# Patient Record
Sex: Male | Born: 1965 | Race: Black or African American | Hispanic: No | Marital: Married | Smoking: Former smoker
Health system: Southern US, Community
[De-identification: ages and names within clinical notes are randomized; demographics above are authoritative.]

## PROBLEM LIST (undated history)

## (undated) DIAGNOSIS — M65961 Unspecified synovitis and tenosynovitis, right lower leg: Secondary | ICD-10-CM

## (undated) DIAGNOSIS — F1490 Cocaine use, unspecified, uncomplicated: Secondary | ICD-10-CM

## (undated) DIAGNOSIS — I10 Essential (primary) hypertension: Secondary | ICD-10-CM

## (undated) DIAGNOSIS — M659 Synovitis and tenosynovitis, unspecified: Secondary | ICD-10-CM

## (undated) DIAGNOSIS — R55 Syncope and collapse: Secondary | ICD-10-CM

## (undated) DIAGNOSIS — I639 Cerebral infarction, unspecified: Secondary | ICD-10-CM

## (undated) DIAGNOSIS — R7989 Other specified abnormal findings of blood chemistry: Secondary | ICD-10-CM

## (undated) HISTORY — DX: Syncope and collapse: R55

## (undated) HISTORY — DX: Other specified abnormal findings of blood chemistry: R79.89

## (undated) HISTORY — DX: Unspecified synovitis and tenosynovitis, right lower leg: M65.961

## (undated) HISTORY — DX: Cerebral infarction, unspecified: I63.9

## (undated) HISTORY — DX: Synovitis and tenosynovitis, unspecified: M65.9

## (undated) HISTORY — PX: APPENDECTOMY: SHX54

## (undated) HISTORY — DX: Cocaine use, unspecified, uncomplicated: F14.90

## (undated) HISTORY — DX: Essential (primary) hypertension: I10

---

## 1999-03-04 ENCOUNTER — Encounter: Payer: Self-pay | Admitting: Emergency Medicine

## 1999-03-04 ENCOUNTER — Emergency Department (HOSPITAL_COMMUNITY): Admission: EM | Admit: 1999-03-04 | Discharge: 1999-03-04 | Payer: Self-pay | Admitting: Emergency Medicine

## 2006-04-06 ENCOUNTER — Emergency Department (HOSPITAL_COMMUNITY): Admission: EM | Admit: 2006-04-06 | Discharge: 2006-04-06 | Payer: Self-pay | Admitting: *Deleted

## 2008-09-15 ENCOUNTER — Emergency Department (HOSPITAL_COMMUNITY): Admission: EM | Admit: 2008-09-15 | Discharge: 2008-09-15 | Payer: Self-pay | Admitting: Emergency Medicine

## 2011-12-25 ENCOUNTER — Ambulatory Visit: Payer: Medicaid Other | Admitting: Physical Medicine & Rehabilitation

## 2012-01-26 ENCOUNTER — Ambulatory Visit: Payer: Medicaid Other | Admitting: Physical Medicine & Rehabilitation

## 2012-02-19 ENCOUNTER — Encounter: Payer: Self-pay | Admitting: Physical Medicine & Rehabilitation

## 2012-02-19 ENCOUNTER — Ambulatory Visit (HOSPITAL_COMMUNITY)
Admission: RE | Admit: 2012-02-19 | Discharge: 2012-02-19 | Disposition: A | Discharge status: Home / Self Care | Payer: Medicaid Other | Source: Ambulatory Visit | Attending: Physical Medicine & Rehabilitation | Admitting: Physical Medicine & Rehabilitation

## 2012-02-19 ENCOUNTER — Ambulatory Visit (HOSPITAL_BASED_OUTPATIENT_CLINIC_OR_DEPARTMENT_OTHER): Payer: Medicaid Other | Admitting: Physical Medicine & Rehabilitation

## 2012-02-19 ENCOUNTER — Encounter: Payer: Medicaid Other | Attending: Physical Medicine & Rehabilitation

## 2012-02-19 VITALS — BP 127/76 | HR 80 | Resp 16 | Ht 70.0 in | Wt 257.0 lb

## 2012-02-19 DIAGNOSIS — M545 Low back pain, unspecified: Secondary | ICD-10-CM | POA: Insufficient documentation

## 2012-02-19 DIAGNOSIS — M533 Sacrococcygeal disorders, not elsewhere classified: Secondary | ICD-10-CM

## 2012-02-19 DIAGNOSIS — M5137 Other intervertebral disc degeneration, lumbosacral region: Secondary | ICD-10-CM | POA: Insufficient documentation

## 2012-02-19 DIAGNOSIS — M51379 Other intervertebral disc degeneration, lumbosacral region without mention of lumbar back pain or lower extremity pain: Secondary | ICD-10-CM | POA: Insufficient documentation

## 2012-02-19 MED ORDER — NAPROXEN 500 MG PO TABS: 500.0000 mg | ORAL_TABLET | Freq: Two times a day (BID) | ORAL | Status: DC

## 2012-02-19 NOTE — Patient Instructions (Signed)
Back Pain, Adult Low back pain is very common. About 1 in 5 people have back pain.The cause of low back pain is rarely dangerous. The pain often gets better over time.About half of people with a sudden onset of back pain feel better in just 2 weeks. About 8 in 10 people feel better by 6 weeks.  CAUSES Some common causes of back pain include:  Strain of the muscles or ligaments supporting the spine.   Wear and tear (degeneration) of the spinal discs.   Arthritis.   Direct injury to the back.  DIAGNOSIS Most of the time, the direct cause of low back pain is not known.However, back pain can be treated effectively even when the exact cause of the pain is unknown.Answering your caregiver's questions about your overall health and symptoms is one of the most accurate ways to make sure the cause of your pain is not dangerous. If your caregiver needs more information, he or she may order lab work or imaging tests (X-rays or MRIs).However, even if imaging tests show changes in your back, this usually does not require surgery. HOME CARE INSTRUCTIONS For many people, back pain returns.Since low back pain is rarely dangerous, it is often a condition that people can learn to manageon their own.   Remain active. It is stressful on the back to sit or stand in one place. Do not sit, drive, or stand in one place for more than 30 minutes at a time. Take short walks on level surfaces as soon as pain allows.Try to increase the length of time you walk each day.   Do not stay in bed.Resting more than 1 or 2 days can delay your recovery.   Do not avoid exercise or work.Your body is made to move.It is not dangerous to be active, even though your back may hurt.Your back will likely heal faster if you return to being active before your pain is gone.   Pay attention to your body when you bend and lift. Many people have less discomfortwhen lifting if they bend their knees, keep the load close to their  bodies,and avoid twisting. Often, the most comfortable positions are those that put less stress on your recovering back.   Find a comfortable position to sleep. Use a firm mattress and lie on your side with your knees slightly bent. If you lie on your back, put a pillow under your knees.   Only take over-the-counter or prescription medicines as directed by your caregiver. Over-the-counter medicines to reduce pain and inflammation are often the most helpful.Your caregiver may prescribe muscle relaxant drugs.These medicines help dull your pain so you can more quickly return to your normal activities and healthy exercise.   Put ice on the injured area.   Put ice in a plastic bag.   Place a towel between your skin and the bag.   Leave the ice on for 15 to 20 minutes, 3 to 4 times a day for the first 2 to 3 days. After that, ice and heat may be alternated to reduce pain and spasms.   Ask your caregiver about trying back exercises and gentle massage. This may be of some benefit.   Avoid feeling anxious or stressed.Stress increases muscle tension and can worsen back pain.It is important to recognize when you are anxious or stressed and learn ways to manage it.Exercise is a great option.  SEEK MEDICAL CARE IF:  You have pain that is not relieved with rest or medicine.   You have   pain that does not improve in 1 week.   You have new symptoms.   You are generally not feeling well.  SEEK IMMEDIATE MEDICAL CARE IF:   You have pain that radiates from your back into your legs.   You develop new bowel or bladder control problems.   You have unusual weakness or numbness in your arms or legs.   You develop nausea or vomiting.   You develop abdominal pain.   You feel faint.  Document Released: 09/28/2005 Document Revised: 09/17/2011 Document Reviewed: 02/16/2011 [employer] Patient Information 2012 [employer]. 

## 2012-02-19 NOTE — Progress Notes (Signed)
  Subjective:    Patient ID: Ross Wheeler, male    DOB: 1966-05-05, 46 y.o.   MRN: [id number]  HPI 10 year history of gradual onset low back pain. No pain shooting down the legs. No history of trauma or injury. Has had some x-rays several years ago but none through the [facility] cone system. Has seen his primary care physician Dr. Loleta Chance. Has tried pain medicines such as oxycodone and hydrocodone. He's also tried muscle relaxers. No physical therapy, no injections, no surgery in the past. 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 General activity 8 Relation with others 8 Enjoyment of life 10 What TIME of day is your pain at its worst? morning Sleep (in general) Poor  Pain is worse with: some activites Pain improves with: medication Relief from Meds: 3  Mobility walk without assistance how many minutes can you walk? all day ability to climb steps?  yes do you drive?  yes Do you have any goals in this area?  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  All other systems reviewed and are negative.       Objective:   Physical Exam  Constitutional: He is oriented to person, place, and time.  Musculoskeletal:       Lumbar back: He exhibits pain. He exhibits normal range of motion, no tenderness and no swelling.       PSIS pain Positive Faber's maneuver with pain in the SI joint area  Neurological: He is alert and oriented to person, place, and time. He has normal strength. No sensory deficit.  Reflex Scores:      Tricep reflexes are  2+ on the right side and 2+ on the left side.      Bicep reflexes are 2+ on the right side and 2+ on the left side.      Brachioradialis reflexes are 2+ on the right side and 2+ on the left side.      Patellar reflexes are 2+ on the right side and 2+ on the left side.      Achilles reflexes are 2+ on the right side and 2+ on the left side.         Assessment & Plan:  1. Axial back pain exam is most consistent with sacroiliac joint dysfunction. Will send to some physical therapy. We'll get lumbosacral spine films. Will start nonsteroidal. Failing this sacroiliac injection. I'll see him back in one month.

## 2012-04-19 ENCOUNTER — Encounter: Payer: Medicaid Other | Attending: Physical Medicine & Rehabilitation

## 2012-04-19 ENCOUNTER — Ambulatory Visit (HOSPITAL_BASED_OUTPATIENT_CLINIC_OR_DEPARTMENT_OTHER): Payer: Medicaid Other | Admitting: Physical Medicine & Rehabilitation

## 2012-04-19 ENCOUNTER — Encounter: Payer: Self-pay | Admitting: Physical Medicine & Rehabilitation

## 2012-04-19 VITALS — BP 139/80 | HR 76 | Resp 16 | Ht 70.0 in | Wt 258.2 lb

## 2012-04-19 DIAGNOSIS — M545 Low back pain, unspecified: Secondary | ICD-10-CM | POA: Insufficient documentation

## 2012-04-19 DIAGNOSIS — M5137 Other intervertebral disc degeneration, lumbosacral region: Secondary | ICD-10-CM

## 2012-04-19 MED ORDER — TRAMADOL HCL 50 MG PO TABS: 50.0000 mg | ORAL_TABLET | Freq: Four times a day (QID) | ORAL | Status: AC | PRN

## 2012-04-19 MED ORDER — MELOXICAM 15 MG PO TABS: 15.0000 mg | ORAL_TABLET | Freq: Every day | ORAL | Status: AC

## 2012-04-19 MED ORDER — CYCLOBENZAPRINE HCL 10 MG PO TABS: 10.0000 mg | ORAL_TABLET | Freq: Every evening | ORAL | Status: AC | PRN

## 2012-04-19 NOTE — Progress Notes (Signed)
Subjective:    Patient ID: Ross Wheeler, male    DOB: [birth year], 46 y.o.   MRN: [id number]  HPI No change in symptoms. Low back pain is the main complaint. No leg or foot pain. Working full-time in Aeronautical engineer Pain Inventory Average Pain 8 Pain Right Now 8 My pain is constant and burning  In the last 24 hours, has pain interfered with the following? General activity 7 Relation with others 7 Enjoyment of life 9 What TIME of day is your pain at its worst? night Sleep (in general) Poor  Pain is worse with: bending Pain improves with: nothing helps Relief from Meds: no pain meds  Mobility walk without assistance ability to climb steps?  yes do you drive?  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  no  Physicians involved in your care Any changes since last visit?  no   Family History  Problem Relation Age of Onset  . Hypertension Mother   . Diabetes Father   . Heart disease Father   . Hypertension Father    History   Social History  . Marital Status: Married    Spouse Name: N/A    Number of Children: N/A  . Years of Education: N/A   Social History Main Topics  . Smoking status: Former Smoker    Types: Cigarettes  . Smokeless tobacco: Never Used  . Alcohol Use: None  . Drug Use: None  . Sexually Active: None   Other Topics Concern  . None   Social History Narrative  . None   Past Surgical History  Procedure Date  . Appendectomy    Past Medical History  Diagnosis Date  . Hypertension    BP 139/80  Pulse 76  Resp 16  Ht 5\' 10"  (1.778 m)  Wt 258 lb 3.2 oz (117.119 kg)  BMI 37.05 kg/m2  SpO2 97%    Review of Systems  Musculoskeletal: Positive for back pain.  All other systems reviewed and are negative.       Objective:   Physical Exam  Constitutional: He is oriented to person, place, and time. He appears  well-developed.       obese  HENT:  Head: Normocephalic and atraumatic.  Eyes: Conjunctivae and EOM are normal. Pupils are equal, round, and reactive to light.  Musculoskeletal: He exhibits no edema and no tenderness.       Lumbar back: He exhibits decreased range of motion, bony tenderness and pain. He exhibits no swelling, no edema and no deformity.       Negative straight leg raising test Tenderness over the PSIS area bilaterally. Pain with bending over as well as to the right  Neurological: He is alert and oriented to person, place, and time. He has normal strength. No sensory deficit. Coordination and gait normal.          Assessment & Plan:  1. 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 No evidence of radiculopathy. X-rays showing L3-L4 and L5-S1 disc space narrowing. In addition there is evidence of spondylosis in the facet joints at L3-4 L4-5 and L5-S1 levels. Will send him to physical therapy once a week for 4 weeks Start tramadol 50 mg 4 times a day as needed for pain Cyclobenzaprine 10 mg each bedtime Mobic in place of Naprosyn. I'll see the patient back in 4 weeks. If  not much better consider MRI of the lumbar spine

## 2012-04-19 NOTE — Patient Instructions (Signed)
You will start taking meloxicam once a dayin place of naproxen You'll start tramadol as needed for pain up to 4 times per day You'll take cyclobenzaprine a muscle relaxer at night before you go to bed You'll go to physical therapy at [facility] 4 visits

## 2012-05-27 ENCOUNTER — Encounter: Payer: Medicaid Other | Attending: Physical Medicine & Rehabilitation

## 2012-05-27 ENCOUNTER — Ambulatory Visit: Payer: Medicaid Other | Admitting: Physical Medicine & Rehabilitation

## 2012-05-27 DIAGNOSIS — M545 Low back pain, unspecified: Secondary | ICD-10-CM | POA: Insufficient documentation

## 2012-06-06 ENCOUNTER — Other Ambulatory Visit: Payer: Self-pay | Admitting: Physical Medicine & Rehabilitation

## 2012-09-22 ENCOUNTER — Other Ambulatory Visit: Payer: Self-pay | Admitting: Physical Medicine & Rehabilitation

## 2013-01-16 ENCOUNTER — Other Ambulatory Visit: Payer: Self-pay | Admitting: Physical Medicine & Rehabilitation

## 2013-04-06 IMAGING — CR DG LUMBAR SPINE 2-3V
3 series · 3 of 3 positions shown · non-contrast
Comparison: None.

CLINICAL DATA: Low back pain for 10 years.

LUMBAR SPINE - 2-3 VIEW

[t l-spine a.p.]
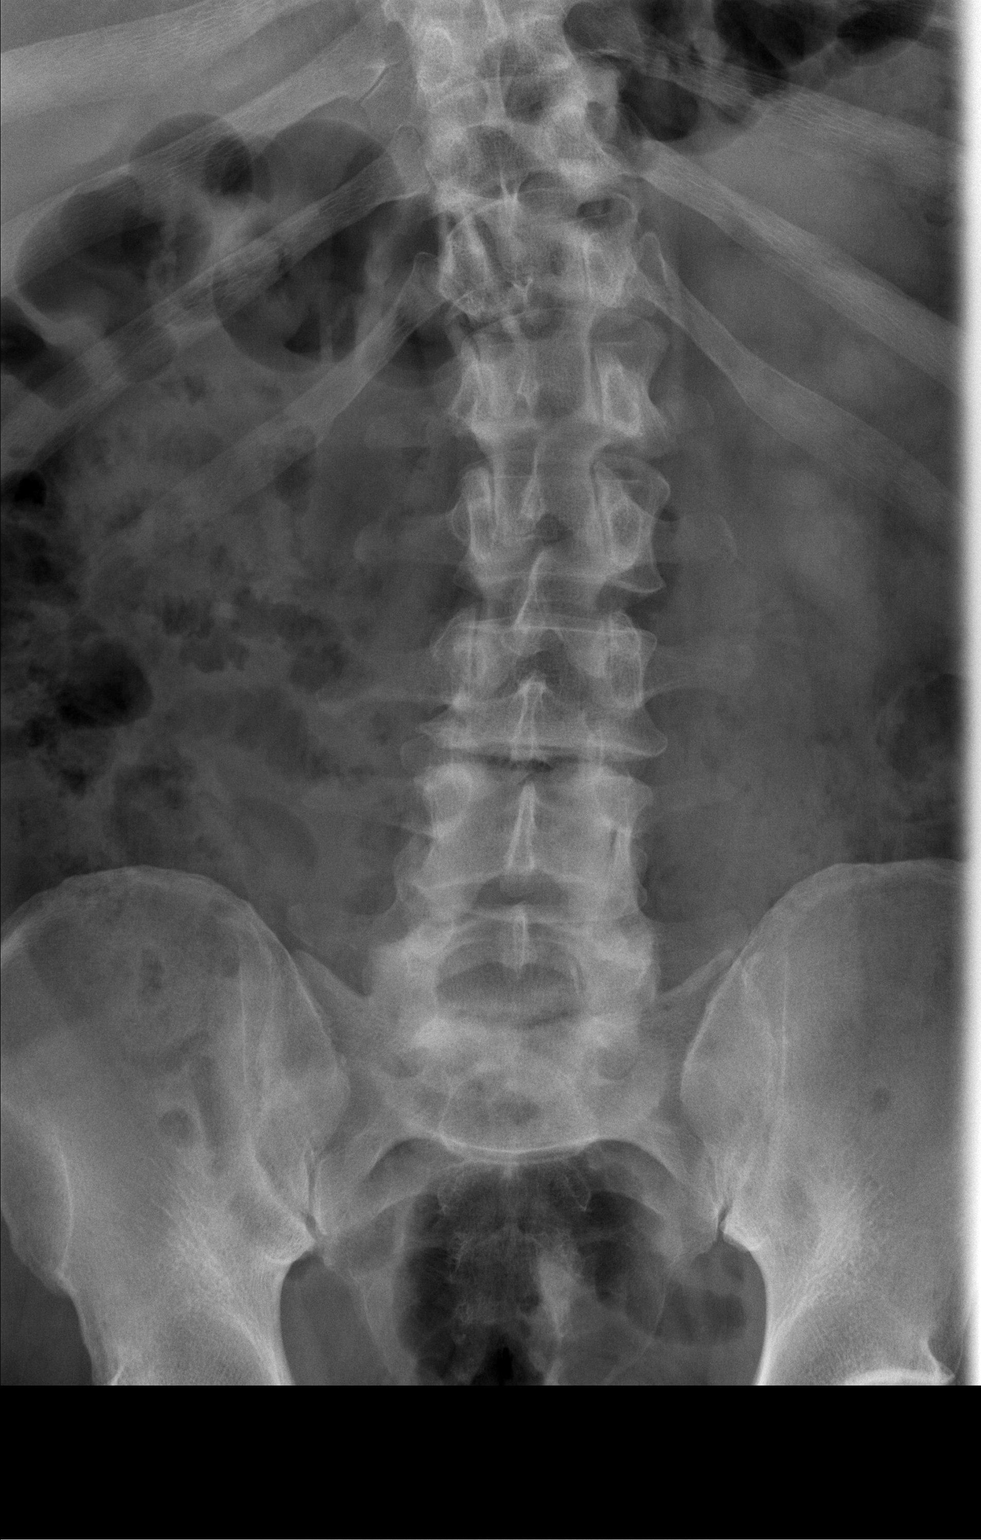

[t l-spine lat]
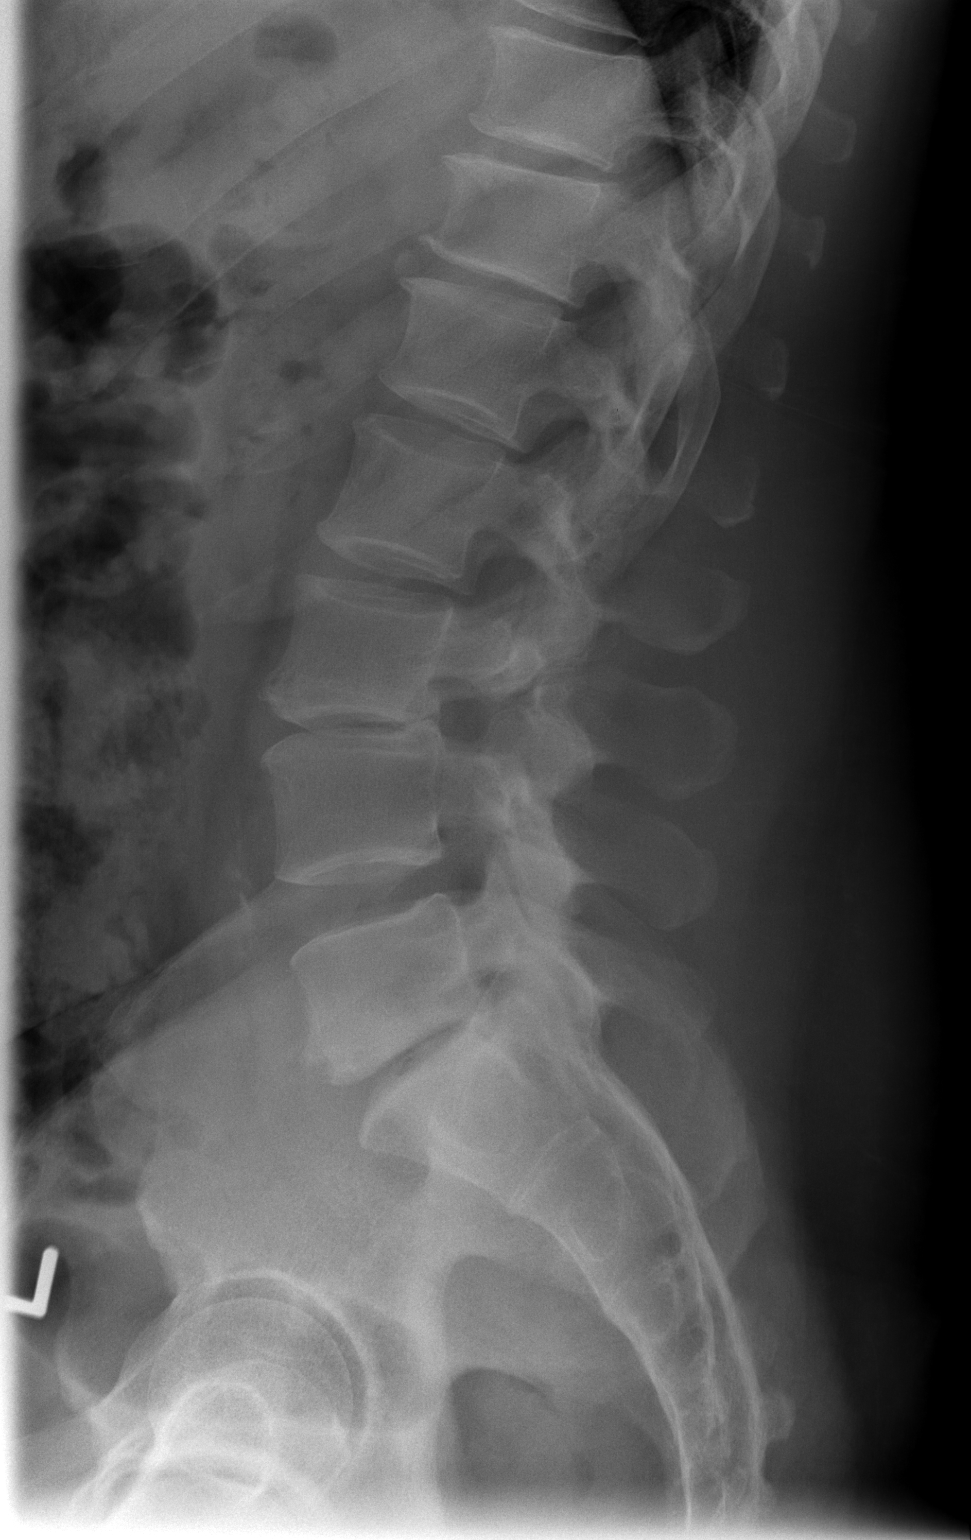

[t l-spine l5-s1 spot]
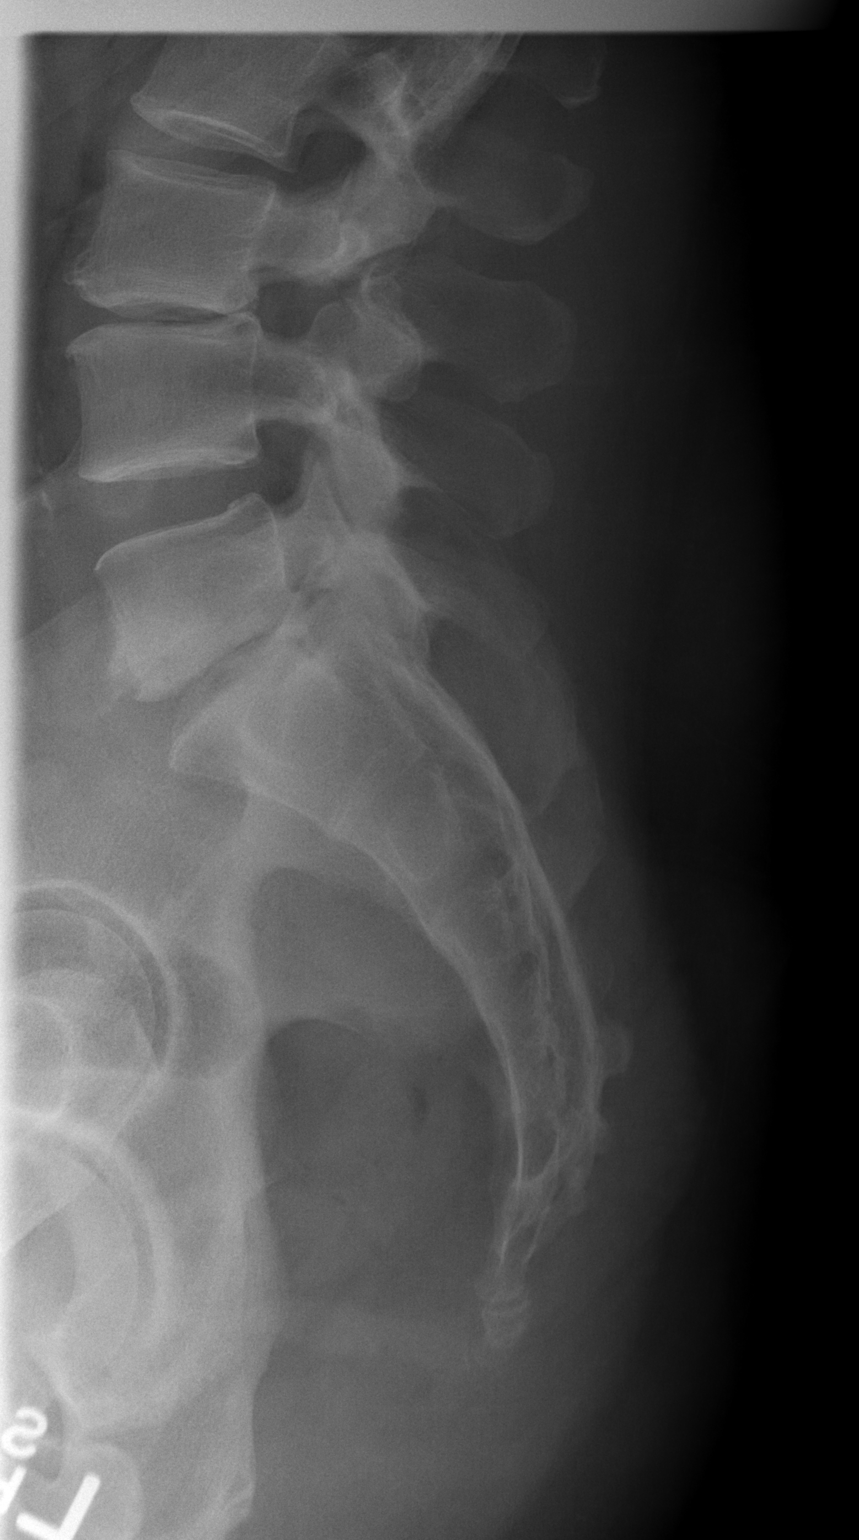

[3 of 3 positions shown; findings below may reference images not displayed]

FINDINGS: Disc space narrowing L3-L4 is accompanied by 3 mm
anterolisthesis..  Also disc space narrowing L5-S1.  No osseous
destructive lesion.  Slight thoracic scoliosis incompletely
evaluated. Other intervertebral disc spaces appear well preserved.
No visible SI joint erosion.
IMPRESSION: Degenerative change as described.

## 2013-07-13 ENCOUNTER — Ambulatory Visit: Payer: Medicaid Other | Admitting: Physical Medicine & Rehabilitation

## 2014-01-19 ENCOUNTER — Ambulatory Visit: Payer: Medicaid Other | Admitting: Physical Medicine & Rehabilitation

## 2019-06-24 ENCOUNTER — Other Ambulatory Visit: Payer: Self-pay

## 2019-06-24 DIAGNOSIS — Z20822 Contact with and (suspected) exposure to covid-19: Secondary | ICD-10-CM

## 2019-06-26 LAB — NOVEL CORONAVIRUS, NAA: SARS-CoV-2, NAA: NOT DETECTED

## 2019-11-23 ENCOUNTER — Encounter: Payer: Self-pay | Admitting: Orthopaedic Surgery

## 2019-11-23 ENCOUNTER — Ambulatory Visit (INDEPENDENT_AMBULATORY_CARE_PROVIDER_SITE_OTHER): Payer: Self-pay

## 2019-11-23 ENCOUNTER — Ambulatory Visit (INDEPENDENT_AMBULATORY_CARE_PROVIDER_SITE_OTHER): Payer: Self-pay | Admitting: Orthopaedic Surgery

## 2019-11-23 ENCOUNTER — Telehealth: Payer: Self-pay | Admitting: Radiology

## 2019-11-23 ENCOUNTER — Other Ambulatory Visit: Payer: Self-pay

## 2019-11-23 VITALS — BP 125/65 | HR 83 | Ht 72.0 in | Wt 225.0 lb

## 2019-11-23 DIAGNOSIS — G8929 Other chronic pain: Secondary | ICD-10-CM

## 2019-11-23 DIAGNOSIS — M7989 Other specified soft tissue disorders: Secondary | ICD-10-CM

## 2019-11-23 DIAGNOSIS — M545 Low back pain: Secondary | ICD-10-CM

## 2019-11-23 NOTE — Telephone Encounter (Signed)
I called patient to advise negative for DVT.  Continue medications given by Dr. Olena Leatherwood and begin PT.  Male answered and we were disconnected. Tried to call back, no answer.   Patient called [place] office back and spoke with Carlon.  She advised on instructions per Dr. Ophelia Charter. Patient concerned with what he should do about the pain that he is in and states Tramadol does not help that much. Patient advised PT will hopefully give him some relief.

## 2019-11-23 NOTE — Progress Notes (Signed)
Office Visit Note   Patient: Ross Wheeler           Date of Birth: 1966/06/09           MRN: [id number] Visit Date: 11/23/2019              Requested by: No referring provider defined for this encounter. PCP: Toma Deiters, MD   Assessment & Plan: Visit Diagnoses:  1. Chronic right-sided low back pain, unspecified whether sciatica present     Plan: We will obtain a venous Doppler make sure he does not have saphenous vein DVT.  We discussed physical therapy referral for evaluation and treatment.  No findings of radiculopathy or hip osteoarthritis on exam.  Patient can call if he like to proceed with the therapy.  Follow-Up Instructions: No follow-ups on file.   Orders:  Orders Placed This Encounter  Procedures  . XR Lumbar Spine 2-3 Views   No orders of the defined types were placed in this encounter.     Procedures: No procedures performed   Clinical Data: No additional findings.   Subjective: Chief Complaint  Patient presents with  . Right Knee - Pain  . Lower Back - Pain    HPI 54 year old male referred by Dr.Hasanaj with some pain anteriorly over is right proximal patellar tendon and also medial groin pain that radiates down his the medial aspect of his thigh to his knee.  He does not recall any specific injury.  Pain is been bad for 2 days.  He has had trouble walking when he tried to keep his knees together as he states it aggravates his pain.  He is using WD-40 and states that does not take the pain away but he is able to move better.  He denies any specific trauma.  No problems putting his socks on.  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 Patient had a prednisone Dosepak and states it really did not help.  He is used some tramadol for pain. Review of Systems positive for hypertension history of ulcers.  Patient does not smoke.  Positive for hypertension on HCTZ and verapamil. Remote history of injury to his right knee when he  was young when he fell and landed on his knee.  Objective: Vital Signs: BP 125/65   Pulse 83   Ht 6' (1.829 m)   Wt 225 lb (102.1 kg)   BMI 30.52 kg/m   Physical Exam Constitutional:      Appearance: He is well-developed.  HENT:     Head: Normocephalic and atraumatic.  Eyes:     Pupils: Pupils are equal, round, and reactive to light.  Neck:     Thyroid: No thyromegaly.     Trachea: No tracheal deviation.  Cardiovascular:     Rate and Rhythm: Normal rate.  Pulmonary:     Effort: Pulmonary effort is normal.     Breath sounds: No wheezing.  Abdominal:     General: Bowel sounds are normal.     Palpations: Abdomen is soft.  Skin:    General: Skin is warm and dry.     Capillary Refill: Capillary refill takes less than 2 seconds.  Neurological:     Mental Status: He is alert and oriented to person, place, and time.  Psychiatric:        Behavior: Behavior normal.        Thought Content: Thought content normal.        Judgment:  Judgment normal.     Ortho Exam patient has negative logroll the hips no limitation internal rotation right or left.  No hip flexion weakness.  Minimal discomfort with adduction resisted testing.  He has some tenderness along the saphenous medial thigh.  Semitendinosis semimembranosus normal no palpable Baker's cyst.  He does have tenderness over the proximal patellar tendon fairly diffuse not localized to the central portion.  Pes bursa is nontender collateral ligaments ACL exam is normal no significant crepitus with knee range of motion.  Specialty Comments:  No specialty comments available.  Imaging: XR Lumbar Spine 2-3 Views  Result Date: 11/23/2019 AP lateral lumbar spine x-rays and AP pelvis show hips obtained and reviewed.  This shows a left thoracolumbar curve.  No hip osteoarthritic changes.  Disc base narrowing at L3-4, L4-5 and L5-S1.  Negative for acute changes. Impression: Lumbar curvature.  X-ray negative for acute changes.    PMFS  History: Patient Active Problem List   Diagnosis Date Noted  . Degeneration of lumbar or lumbosacral intervertebral disc 04/19/2012  . Sacroiliac dysfunction 02/19/2012   Past Medical History:  Diagnosis Date  . Hypertension     Family History  Problem Relation Age of Onset  . Hypertension Mother   . Diabetes Father   . Heart disease Father   . Hypertension Father     Past Surgical History:  Procedure Laterality Date  . APPENDECTOMY     Social History   Occupational History  . Not on file  Tobacco Use  . Smoking status: Former Smoker    Types: Cigarettes  . Smokeless tobacco: Never Used  Substance and Sexual Activity  . Alcohol use: Not on file  . Drug use: Not on file  . Sexual activity: Not on file

## 2020-04-18 ENCOUNTER — Ambulatory Visit: Payer: Self-pay | Admitting: Orthopaedic Surgery

## 2020-04-18 ENCOUNTER — Other Ambulatory Visit: Payer: Self-pay

## 2020-04-25 ENCOUNTER — Other Ambulatory Visit: Payer: Self-pay

## 2020-04-25 ENCOUNTER — Ambulatory Visit: Payer: Self-pay | Admitting: Orthopaedic Surgery

## 2020-05-02 ENCOUNTER — Ambulatory Visit (INDEPENDENT_AMBULATORY_CARE_PROVIDER_SITE_OTHER): Payer: Self-pay | Admitting: Orthopaedic Surgery

## 2020-05-02 ENCOUNTER — Encounter: Payer: Self-pay | Admitting: Orthopaedic Surgery

## 2020-05-02 ENCOUNTER — Other Ambulatory Visit: Payer: Self-pay

## 2020-05-02 ENCOUNTER — Ambulatory Visit (INDEPENDENT_AMBULATORY_CARE_PROVIDER_SITE_OTHER): Payer: Self-pay

## 2020-05-02 VITALS — BP 116/84 | Ht 72.0 in | Wt 215.0 lb

## 2020-05-02 DIAGNOSIS — M542 Cervicalgia: Secondary | ICD-10-CM

## 2020-05-02 DIAGNOSIS — M4722 Other spondylosis with radiculopathy, cervical region: Secondary | ICD-10-CM | POA: Insufficient documentation

## 2020-05-02 DIAGNOSIS — M659 Synovitis and tenosynovitis, unspecified: Secondary | ICD-10-CM

## 2020-05-02 NOTE — Progress Notes (Signed)
Office Visit Note   Patient: Ross Wheeler           Date of Birth: 1966-02-04           MRN: [id number] Visit Date: 05/02/2020              Requested by: Toma Deiters, MD [id number] [place],  [place] [address] PCP: Toma Deiters, MD   Assessment & Plan: Visit Diagnoses:  1. Neck pain   2. Synovitis of right knee   3. Other spondylosis with radiculopathy, cervical region     Plan: We reviewed the x-rays of his neck that showed significant spondylosis.  Intra-articular injection performed in his knee we will see how he does with this and recheck him in 3 weeks.  He is having persistent problems with his neck we may need to consider diagnostic imaging cervical spine.  We reviewed the plain x-rays which showed some calcific tendinopathy of the proximal midportion of the patella which appears old and chronic.  I discussed with him that this is not a fracture of the inferior pole of the patella.  Recheck 3 weeks.  Follow-Up Instructions: Return in about 3 weeks (around 05/23/2020).   Orders:  Orders Placed This Encounter  Procedures  . XR Cervical Spine 2 or 3 views   No orders of the defined types were placed in this encounter.     Procedures: No procedures performed   Clinical Data: No additional findings.   Subjective: Chief Complaint  Patient presents with  . Neck - Pain  . Right Knee - Pain    HPI 54 year old male seen with significant neck pain greater than a 6 on a 1-10 scale times greater than a year.  Patient states she fell on 13th went to the ER June 8 and 9 that x-rays which showed bone spurs.  There is question about whether there was fracture of the inferior pole of the patella.  Patient states he has had pain in his neck and also pain in his right knee medial posterior medial.  No myelopathic changes.  Patient got some improvement with Medrol Dosepak.  He is used Tylenol Zanaflex Ultram.  Review of Systems 14 point systems otherwise negative  noncontributory to HPI.   Objective: Vital Signs: BP 116/84   Ht 6' (1.829 m)   Wt (!) 215 lb (97.5 kg)   BMI 29.16 kg/m   Physical Exam Constitutional:      Appearance: He is well-developed.  HENT:     Head: Normocephalic and atraumatic.  Eyes:     Pupils: Pupils are equal, round, and reactive to light.  Neck:     Thyroid: No thyromegaly.     Trachea: No tracheal deviation.  Cardiovascular:     Rate and Rhythm: Normal rate.  Pulmonary:     Effort: Pulmonary effort is normal.     Breath sounds: No wheezing.  Abdominal:     General: Bowel sounds are normal.     Palpations: Abdomen is soft.  Skin:    General: Skin is warm and dry.     Capillary Refill: Capillary refill takes less than 2 seconds.  Neurological:     Mental Status: He is alert and oriented to person, place, and time.  Psychiatric:        Behavior: Behavior normal.        Thought Content: Thought content normal.        Judgment: Judgment normal.     Ortho  Exam Patient has bilateral brachial plexus tenderness increased pain with cervical compression some relief with distraction.  Biceps triceps brachial radialis are strong negative drop arm test.  Increased pain with forward flexion.  No lower extremity clonus.  No tenderness at the proximal portion of the patella.  Medial and posterior medial joint line pain.  Negative logroll of the hips distal pulses are 2+. Right knee has 2+ synovitis present.  Negative Lachman negative pivot shift.  Some pain with patellofemoral loading and quadriceps contracture. Specialty Comments:  No specialty comments available.  Imaging: No results found.   PMFS History: Patient Active Problem List   Diagnosis Date Noted  . Synovitis of right knee 05/02/2020  . Other spondylosis with radiculopathy, cervical region 05/02/2020  . Degeneration of lumbar or lumbosacral intervertebral disc 04/19/2012  . Sacroiliac dysfunction 02/19/2012   Past Medical History:  Diagnosis Date    . Hypertension     Family History  Problem Relation Age of Onset  . Hypertension Mother   . Diabetes Father   . Heart disease Father   . Hypertension Father     Past Surgical History:  Procedure Laterality Date  . APPENDECTOMY     Social History   Occupational History  . Not on file  Tobacco Use  . Smoking status: Former Smoker    Types: Cigarettes  . Smokeless tobacco: Never Used  Substance and Sexual Activity  . Alcohol use: Not on file  . Drug use: Not on file  . Sexual activity: Not on file

## 2020-05-07 ENCOUNTER — Telehealth: Payer: Self-pay | Admitting: Radiology

## 2020-05-07 NOTE — Telephone Encounter (Signed)
Please see message from patient below.  Can you fax note?  [facility] is working with [facility] and would like his notes faxed to [phone] Attn: Ronita Hipps  Their telephone number is [phone] if you need to call them for verification

## 2020-05-13 NOTE — Telephone Encounter (Signed)
[facility], they do have him as a client but, they do not have a consent to fax. They will prepare one for patient to sign and once he signs they will send over to [place].

## 2020-05-23 ENCOUNTER — Other Ambulatory Visit: Payer: Self-pay

## 2020-05-23 ENCOUNTER — Encounter: Payer: Self-pay | Admitting: Orthopaedic Surgery

## 2020-05-23 ENCOUNTER — Ambulatory Visit (INDEPENDENT_AMBULATORY_CARE_PROVIDER_SITE_OTHER): Payer: Self-pay | Admitting: Orthopaedic Surgery

## 2020-05-23 VITALS — BP 126/68 | Ht 72.0 in | Wt 220.0 lb

## 2020-05-23 DIAGNOSIS — M25561 Pain in right knee: Secondary | ICD-10-CM

## 2020-05-27 NOTE — Progress Notes (Signed)
Office Visit Note   Patient: Ross Wheeler           Date of Birth: 04/25/1966           MRN: [id number] Visit Date: 05/23/2020              Requested by: Toma Deiters, MD [address] [place],  [place] [address] PCP: Toma Deiters, MD   Assessment & Plan: Visit Diagnoses:  1. Right knee pain, unspecified chronicity     Plan: Plan MRI scan right knee.  He asked about stronger narcotic medication I discussed with him I recommend diagnostic imaging.  He has had several prescriptions for tramadol in the past which he states works slightly but not really that well.  Follow-up after MRI scan.  Follow-Up Instructions: No follow-ups on file.   Orders:  Orders Placed This Encounter  Procedures  . MR Knee Right w/o contrast   No orders of the defined types were placed in this encounter.     Procedures: No procedures performed   Clinical Data: No additional findings.   Subjective: Chief Complaint  Patient presents with  . Neck - Follow-up  . Right Knee - Follow-up    HPI 54 year old male returns with ongoing problems with right knee pain.  Patient states that since the injection 05/02/2020 he is not gotten any relief his pain is gotten to the point where it is rated 10 out of 10 and he is having great difficulty walking.  He is used ibuprofen and states has not been able to work because of his knee being so severely painful.  He states he has pain with every step pain wakes him up at night.  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  He states he is tender along the medial joint line lateral joint line.  No groin pain no numbness or tingling in the feet.  Patient works as a Estate agent.  He is used tramadol for pain in the past.  [facility]  website reviewed narcotics 311, sedative 130 overall risk or 230. He relates old history of injuring his knee when he fell on it when he was young. Review  of Systems 14 point systems otherwise noncontributory to HPI.  No history of gout.  Positive for cervical spondylosis multilevel.   Objective: Vital Signs: BP 126/68   Ht 6' (1.829 m)   Wt 220 lb (99.8 kg)   BMI 29.84 kg/m   Physical Exam Constitutional:      Appearance: He is well-developed.  HENT:     Head: Normocephalic and atraumatic.  Eyes:     Pupils: Pupils are equal, round, and reactive to light.  Neck:     Thyroid: No thyromegaly.     Trachea: No tracheal deviation.  Cardiovascular:     Rate and Rhythm: Normal rate.  Pulmonary:     Effort: Pulmonary effort is normal.     Breath sounds: No wheezing.  Abdominal:     General: Bowel sounds are normal.     Palpations: Abdomen is soft.  Skin:    General: Skin is warm and dry.     Capillary Refill: Capillary refill takes less than 2 seconds.  Neurological:     Mental Status: He is alert and oriented to person, place, and time.  Psychiatric:        Behavior: Behavior normal.  Thought Content: Thought content normal.        Judgment: Judgment normal.     Ortho Exam patient has negative pivot shift he complains of severe pain with palpation along the medial joint line and lateral joint line.  Negative logroll to the hips.  Pes bursa is moderately tender some discomfort with patellar loading.  Calcification of the proximal portion of the patellar tendon corresponding with x-ray but not particularly tender.  Quad tendon is normal.  Negative apprehension test.  Medial collateral ligament exam is normal.  Opposite knee shows negative anterior drawer negative pivot shift good range of motion.  Distal pulses are 2 Homan.  Specialty Comments:  No specialty comments available.  Imaging: No results found.   PMFS History: Patient Active Problem List   Diagnosis Date Noted  . Synovitis of right knee 05/02/2020  . Other spondylosis with radiculopathy, cervical region 05/02/2020  . Degeneration of lumbar or lumbosacral  intervertebral disc 04/19/2012  . Sacroiliac dysfunction 02/19/2012   Past Medical History:  Diagnosis Date  . Hypertension     Family History  Problem Relation Age of Onset  . Hypertension Mother   . Diabetes Father   . Heart disease Father   . Hypertension Father     Past Surgical History:  Procedure Laterality Date  . APPENDECTOMY     Social History   Occupational History  . Not on file  Tobacco Use  . Smoking status: Former Smoker    Types: Cigarettes  . Smokeless tobacco: Never Used  Substance and Sexual Activity  . Alcohol use: Not on file  . Drug use: Not on file  . Sexual activity: Not on file

## 2020-06-06 ENCOUNTER — Ambulatory Visit (INDEPENDENT_AMBULATORY_CARE_PROVIDER_SITE_OTHER): Payer: Self-pay | Admitting: Orthopaedic Surgery

## 2020-06-06 ENCOUNTER — Encounter: Payer: Self-pay | Admitting: Orthopaedic Surgery

## 2020-06-06 ENCOUNTER — Other Ambulatory Visit: Payer: Self-pay

## 2020-06-06 VITALS — Ht 72.0 in | Wt 220.0 lb

## 2020-06-06 DIAGNOSIS — M659 Synovitis and tenosynovitis, unspecified: Secondary | ICD-10-CM

## 2020-06-07 NOTE — Progress Notes (Signed)
Office Visit Note   Patient: Ross Wheeler           Date of Birth: 1966-04-05           MRN: [id number] Visit Date: 06/06/2020              Requested by: Toma Deiters, MD [address] [place],  [place] [address] PCP: Toma Deiters, MD   Assessment & Plan: Visit Diagnoses: No diagnosis found.  Plan: I discussed with patient no evidence of ligamentous injury he does have trace degenerative changes not unusual for his age of 54.  We discussed using anti-inflammatories.  He asked about stronger narcotic medication I discussed with him that it might help him for a very brief time that he would then  need higher dosages, increased amounts and is not recommended for chronic pain problems.  He can continue to use Aspercreme, ice at the end of the day and ibuprofen or Aleve. [facility] website review  Narx Report.   Follow-Up Instructions: Return if symptoms worsen or fail to improve.   Orders:  No orders of the defined types were placed in this encounter.  No orders of the defined types were placed in this encounter.     Procedures: No procedures performed   Clinical Data: No additional findings.   Subjective: Chief Complaint  Patient presents with  . Right Knee - Pain, Follow-up    MRI right knee review    HPI 54 year old male returns with ongoing problems with his knee.  He states he has pain along the edge of the patella both medial joint line and lateral joint line.  He is taken anti-inflammatories also had some tramadol and states that this is not really working for him.  He does lawn maintenance work and states whenever he is on his feet he has some increased pain.  MRI scan of the knee has been obtained which shows slight area calcification superior portion of the patellar tendon appears old and chronic consistent with plain radiographs.  His meniscus cruciate ligaments are normal patellofemoral joint is normal.  Medial lateral compartment just shows extremely mild  degenerative changes.  Patient continues to have some problems with his neck where some spondylosis is present.  Review of Systems 14 point update unchanged from 11/23/2019 office visit other than as mentioned above.  Objective: Vital Signs: Ht 6' (1.829 m)   Wt 220 lb (99.8 kg)   BMI 29.84 kg/m   Physical Exam Constitutional:      Appearance: He is well-developed.  HENT:     Head: Normocephalic and atraumatic.  Eyes:     Pupils: Pupils are equal, round, and reactive to light.  Neck:     Thyroid: No thyromegaly.     Trachea: No tracheal deviation.  Cardiovascular:     Rate and Rhythm: Normal rate.  Pulmonary:     Effort: Pulmonary effort is normal.     Breath sounds: No wheezing.  Abdominal:     General: Bowel sounds are normal.     Palpations: Abdomen is soft.  Skin:    General: Skin is warm and dry.     Capillary Refill: Capillary refill takes less than 2 seconds.  Neurological:     Mental Status: He is alert and oriented to person, place, and time.  Psychiatric:        Behavior: Behavior normal.        Thought Content: Thought content normal.  Judgment: Judgment normal.     Ortho Exam no tenderness of the superior portion of the right patellar tendon.  He complains of pain with some palpation at the anterior joint line medially and laterally.  Collateral ligaments are stable ACL PCL exam is normal negative logroll the hips knee and ankle jerk are intact anterior tib gastrocsoleus is intact. Specialty Comments:  No specialty comments available.  Imaging: MRI knee done [facility]   Medial: Mildly degenerated.   Lateral: Mildly degenerated.   Joint: No joint effusion.   Popliteal Fossa: Tiny Baker's cyst.   Extensor Mechanism: Intact. Small ossification in the superior  fibers of the patellar tendon consistent with remote tendinopathy  noted.   Bones: No fracture, stress change or focal lesion.   Other: None.   IMPRESSION:    Negative for meniscal or ligamenttear.   Small ossifications superior fibers of the patellar tendon is likely  due to remote tendinopathy or strain. Negative for tear.   Mild medial and lateral compartment degenerative change.    Electronically Signed   By: Drusilla Kanner M.D.  On: 06/05/2020 13:51    PMFS History: Patient Active Problem List   Diagnosis Date Noted  . Synovitis of right knee 05/02/2020  . Other spondylosis with radiculopathy, cervical region 05/02/2020  . Degeneration of lumbar or lumbosacral intervertebral disc 04/19/2012  . Sacroiliac dysfunction 02/19/2012   Past Medical History:  Diagnosis Date  . Hypertension     Family History  Problem Relation Age of Onset  . Hypertension Mother   . Diabetes Father   . Heart disease Father   . Hypertension Father     Past Surgical History:  Procedure Laterality Date  . APPENDECTOMY     Social History   Occupational History  . Not on file  Tobacco Use  . Smoking status: Former Smoker    Types: Cigarettes  . Smokeless tobacco: Never Used  Substance and Sexual Activity  . Alcohol use: Not on file  . Drug use: Not on file  . Sexual activity: Not on file

## 2020-08-08 ENCOUNTER — Other Ambulatory Visit: Payer: Self-pay

## 2020-08-08 ENCOUNTER — Ambulatory Visit (INDEPENDENT_AMBULATORY_CARE_PROVIDER_SITE_OTHER): Payer: Self-pay | Admitting: Orthopaedic Surgery

## 2020-08-08 ENCOUNTER — Encounter: Payer: Self-pay | Admitting: Orthopaedic Surgery

## 2020-08-08 VITALS — Ht 72.0 in | Wt 220.0 lb

## 2020-08-08 DIAGNOSIS — M659 Synovitis and tenosynovitis, unspecified: Secondary | ICD-10-CM

## 2020-08-08 DIAGNOSIS — M7651 Patellar tendinitis, right knee: Secondary | ICD-10-CM | POA: Insufficient documentation

## 2020-08-08 MED ORDER — LIDOCAINE HCL 1 % IJ SOLN
0.5000 mL | INTRAMUSCULAR | Status: AC | PRN
  Administered 2020-08-08: .5 mL

## 2020-08-08 MED ORDER — METHYLPREDNISOLONE ACETATE 40 MG/ML IJ SUSP
40.0000 mg | INTRAMUSCULAR | Status: AC | PRN
  Administered 2020-08-08: 40 mg via INTRA_ARTICULAR

## 2020-08-08 MED ORDER — BUPIVACAINE HCL 0.25 % IJ SOLN
4.0000 mL | INTRAMUSCULAR | Status: AC | PRN
  Administered 2020-08-08: 4 mL via INTRA_ARTICULAR

## 2020-08-08 NOTE — Progress Notes (Signed)
Office Visit Note   Patient: Ross Wheeler           Date of Birth: May 19, 1966           MRN: [id number] Visit Date: 08/08/2020              Requested by: Toma Deiters, MD [address] [place],  [place] [address] PCP: Toma Deiters, MD   Assessment & Plan: Visit Diagnoses:  1. Synovitis of right knee   2. Patellar tendinitis of right knee     Plan: Repeat injection performed.  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  He can look into getting a Cho-Pat strap and see if this helps his knee symptoms with his calcific tendinopathy.  Follow-up as needed.  Follow-Up Instructions: No follow-ups on file.   Orders:  Orders Placed This Encounter  Procedures  . Large Joint Inj   No orders of the defined types were placed in this encounter.     Procedures: Large Joint Inj: R knee on 08/08/2020 4:34 PM Indications: pain and joint swelling Details: 22 G 1.5 in needle, anterolateral approach  Arthrogram: No  Medications: 40 mg methylPREDNISolone acetate 40 MG/ML; 0.5 mL lidocaine 1 %; 4 mL bupivacaine 0.25 % Outcome: tolerated well, no immediate complications Procedure, treatment alternatives, risks and benefits explained, specific risks discussed. Consent was given by the patient. Immediately prior to procedure a time out was called to verify the correct patient, procedure, equipment, support staff and site/side marked as required. Patient was prepped and draped in the usual sterile fashion.       Clinical Data: No additional findings.   Subjective: Chief Complaint  Patient presents with  . 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  He does more lifting is having increased problems with his  right knee but the knee immobilizer on when he gets home.  He does better if his knee is extended points directly over the patellar tendon where he has pain and some pain medially that occasionally radiates up following the gracilis.  He does have some spondylosis cervical spine.  He is looked into job with truck driving school coming up shortly.  PDMP reviewed once again.  Patient's been on anti-inflammatories.  He did get short-term relief with previous injection.  MRI scan showed some calcific tendinopathy at the proximal portion of the patellar tendon. MRI did show some mild medial compartment chondral wear.  Menisci were intact. Review of Systems 14 point system update unchanged from 8/12 and 05/02/2020 office visits.   Objective: Vital Signs: Ht 6' (1.829 m)   Wt 220 lb (99.8 kg)   BMI 29.84 kg/m   Physical Exam Constitutional:      Appearance: He is well-developed.  HENT:     Head: Normocephalic and atraumatic.  Eyes:     Pupils: Pupils are equal, round, and reactive to light.  Neck:     Thyroid: No thyromegaly.     Trachea: No tracheal deviation.  Cardiovascular:     Rate and Rhythm: Normal rate.  Pulmonary:     Effort: Pulmonary effort is normal.     Breath sounds: No wheezing.  Abdominal:     General:  Bowel sounds are normal.     Palpations: Abdomen is soft.  Skin:    General: Skin is warm and dry.     Capillary Refill: Capillary refill takes less than 2 seconds.  Neurological:     Mental Status: He is alert and oriented to person, place, and time.  Psychiatric:        Behavior: Behavior normal.        Thought Content: Thought content normal.        Judgment: Judgment normal.     Ortho Exam patient withdraws with palpation approximate for the patellar tendon.  He has medial joint line tenderness.  Trace effusion is noted.  Specialty Comments:  No specialty comments available.  Imaging: No results found.   PMFS History: Patient Active Problem List    Diagnosis Date Noted  . Patellar tendinitis of right knee 08/08/2020  . Synovitis of right knee 05/02/2020  . Other spondylosis with radiculopathy, cervical region 05/02/2020  . Degeneration of lumbar or lumbosacral intervertebral disc 04/19/2012  . Sacroiliac dysfunction 02/19/2012   Past Medical History:  Diagnosis Date  . Hypertension     Family History  Problem Relation Age of Onset  . Hypertension Mother   . Diabetes Father   . Heart disease Father   . Hypertension Father     Past Surgical History:  Procedure Laterality Date  . APPENDECTOMY     Social History   Occupational History  . Not on file  Tobacco Use  . Smoking status: Former Smoker    Types: Cigarettes  . Smokeless tobacco: Never Used  Substance and Sexual Activity  . Alcohol use: Not on file  . Drug use: Not on file  . Sexual activity: Not on file

## 2020-08-20 ENCOUNTER — Other Ambulatory Visit (HOSPITAL_COMMUNITY): Payer: Self-pay | Admitting: Internal Medicine

## 2020-08-20 DIAGNOSIS — G8929 Other chronic pain: Secondary | ICD-10-CM

## 2020-09-02 ENCOUNTER — Other Ambulatory Visit: Payer: Self-pay

## 2020-09-02 ENCOUNTER — Ambulatory Visit (HOSPITAL_COMMUNITY)
Admission: RE | Admit: 2020-09-02 | Discharge: 2020-09-02 | Disposition: A | Discharge status: Home / Self Care | Payer: 59 | Source: Ambulatory Visit | Attending: Internal Medicine | Admitting: Internal Medicine

## 2020-09-02 DIAGNOSIS — M5442 Lumbago with sciatica, left side: Secondary | ICD-10-CM | POA: Insufficient documentation

## 2020-09-02 DIAGNOSIS — G8929 Other chronic pain: Secondary | ICD-10-CM | POA: Insufficient documentation

## 2020-12-26 NOTE — Congregational Nurse Program (Signed)
  Dept: [phone]   Congregational Nurse Program Note  Date of Encounter: 12/24/2020  Past Medical History: Past Medical History:  Diagnosis Date  . Hypertension     Encounter Details:  CNP Questionnaire - 12/26/20 2230      Questionnaire   Do you give verbal consent to treat you today? Yes    Visit Setting Church or Engineer, technical sales Patient Served At Home of [employer], [place]    Patient Status Homeless    Medical Provider Yes    Insurance Uninsured (Includes [employer] Card/Care Connects)    Intervention Refer;Assess (including screenings);Educate    Housing/Utilities No permanent housing    Transportation Need transportation assistance    Medication Have medication insecurities    Referrals [facility]         Stated he has history of hypertension, but since he lost his job he doesn't have any insurance and ran out of medications. Offered to send him to be seen in the ER but he declined. Discussed the importance of his prescribed medication,watching his sodium intake and getting exercisei. Will make an. Appointment for him to be seen at [facility]. Called and scheduled an appointment for Tuesday 3/22 @ 8:15 am [facility] RN, [place], [phone]

## 2022-12-24 ENCOUNTER — Encounter (HOSPITAL_COMMUNITY): Payer: Self-pay

## 2022-12-26 ENCOUNTER — Emergency Department (HOSPITAL_COMMUNITY)
Admission: EM | Admit: 2022-12-26 | Discharge: 2022-12-26 | Disposition: A | Discharge status: Home / Self Care | Payer: Medicaid Other | Attending: Emergency Medicine | Admitting: Emergency Medicine

## 2022-12-26 ENCOUNTER — Emergency Department (HOSPITAL_COMMUNITY): Payer: Medicaid Other

## 2022-12-26 DIAGNOSIS — Z87891 Personal history of nicotine dependence: Secondary | ICD-10-CM | POA: Diagnosis not present

## 2022-12-26 DIAGNOSIS — Z79899 Other long term (current) drug therapy: Secondary | ICD-10-CM | POA: Diagnosis not present

## 2022-12-26 DIAGNOSIS — R519 Headache, unspecified: Secondary | ICD-10-CM | POA: Diagnosis present

## 2022-12-26 DIAGNOSIS — R112 Nausea with vomiting, unspecified: Secondary | ICD-10-CM | POA: Insufficient documentation

## 2022-12-26 DIAGNOSIS — I10 Essential (primary) hypertension: Secondary | ICD-10-CM | POA: Diagnosis not present

## 2022-12-26 DIAGNOSIS — R531 Weakness: Secondary | ICD-10-CM | POA: Insufficient documentation

## 2022-12-26 LAB — COMPREHENSIVE METABOLIC PANEL
ALT: 116 U/L — ABNORMAL HIGH (ref 0–44)
AST: 40 U/L (ref 15–41)
Albumin: 4 g/dL (ref 3.5–5.0)
Alkaline Phosphatase: 69 U/L (ref 38–126)
Anion gap: 12 (ref 5–15)
BUN: 16 mg/dL (ref 6–20)
CO2: 25 mmol/L (ref 22–32)
Calcium: 9.5 mg/dL (ref 8.9–10.3)
Chloride: 99 mmol/L (ref 98–111)
Creatinine, Ser: 1.02 mg/dL (ref 0.61–1.24)
GFR, Estimated: >60 mL/min (ref >60)
Glucose, Bld: 129 mg/dL — ABNORMAL HIGH (ref 70–99)
Potassium: 3.9 mmol/L (ref 3.5–5.1)
Sodium: 136 mmol/L (ref 135–145)
Total Bilirubin: 0.7 mg/dL (ref 0.3–1.2)
Total Protein: 7.5 g/dL (ref 6.5–8.1)

## 2022-12-26 LAB — CBC WITH DIFFERENTIAL/PLATELET
Abs Immature Granulocytes: 0.06 10*3/uL (ref 0.00–0.07)
Basophils Absolute: 0.1 10*3/uL (ref 0.0–0.1)
Basophils Relative: 0 %
Eosinophils Absolute: 0.2 10*3/uL (ref 0.0–0.5)
Eosinophils Relative: 1 %
HCT: 52.7 % — ABNORMAL HIGH (ref 39.0–52.0)
Hemoglobin: 16.9 g/dL (ref 13.0–17.0)
Immature Granulocytes: 0 %
Lymphocytes Relative: 9 %
Lymphs Abs: 1.2 10*3/uL (ref 0.7–4.0)
MCH: 26.2 pg (ref 26.0–34.0)
MCHC: 32.1 g/dL (ref 30.0–36.0)
MCV: 81.8 fL (ref 80.0–100.0)
Monocytes Absolute: 0.7 10*3/uL (ref 0.1–1.0)
Monocytes Relative: 5 %
Neutro Abs: 11.3 10*3/uL — ABNORMAL HIGH (ref 1.7–7.7)
Neutrophils Relative %: 85 %
Platelets: 321 10*3/uL (ref 150–400)
RBC: 6.44 MIL/uL — ABNORMAL HIGH (ref 4.22–5.81)
RDW: 13.9 % (ref 11.5–15.5)
WBC: 13.4 10*3/uL — ABNORMAL HIGH (ref 4.0–10.5)
nRBC: 0 % (ref 0.0–0.2)

## 2022-12-26 MED ORDER — HYDROMORPHONE HCL 1 MG/ML IJ SOLN
1.0000 mg | Freq: Once | INTRAMUSCULAR | Status: AC
  Administered 2022-12-26: 1 mg via INTRAVENOUS
  Filled 2022-12-26: qty 1

## 2022-12-26 MED ORDER — ONDANSETRON HCL 4 MG/2ML IJ SOLN
4.0000 mg | Freq: Once | INTRAMUSCULAR | Status: AC
  Administered 2022-12-26: 4 mg via INTRAVENOUS
  Filled 2022-12-26: qty 2

## 2022-12-26 MED ORDER — ONDANSETRON 4 MG PO TBDP: 4.0000 mg | ORAL_TABLET | Freq: Three times a day (TID) | ORAL | 1 refills | Status: AC | PRN

## 2022-12-26 NOTE — Discharge Instructions (Signed)
Neurology recommends close and early follow-up with Dr. Leonie Man with [facility] neurology.  They actually want his blood pressures to kind to be 1 30-1 50 range.  They do not see any reason for any additional MRIs today.  Take the Zofran as needed for nausea vomiting.  Return for any new or worse symptoms.  Continue your workup to get occupational therapy and physical therapy to evaluate him.

## 2022-12-26 NOTE — ED Triage Notes (Signed)
Pt bib ems from home c/o migraine that started today. Pt states he is nauseous and vomited once en route to hospital. 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 Pt recently started Plavix.   BP 170/110 HR 75 RA 95% CBG 124

## 2022-12-26 NOTE — ED Provider Notes (Addendum)
[facility] Provider Note   CSN: [id number] Arrival date & time: 12/26/22  1048     History  Chief Complaint  Patient presents with   Migraine    Ross Wheeler is a 57 y.o. male.  Patient brought in with a complaint of headache not severe that started today and associated with vomiting with some nausea.  No diarrhea.  No abdominal pain no chest pain no shortness of breath.  Patient was discharged from [facility] on February 29 following CVA with right-sided deficits including some visual deficits.  Then was readmitted March 13 and discharged yesterday March 15 for an extension of the CVA in the same area.  Patient's area of CVA is left thalamus posterior aspect of the hippocampus and left occipital lobe.  There was no history of any hemorrhagic strokes or bleeds.  Past medical history is significant for hypertension hyperlipidemia former smoker       Home Medications Prior to Admission medications   Medication Sig Start Date End Date Taking? Authorizing Provider  ondansetron (ZOFRAN-ODT) 4 MG disintegrating tablet Take 1 tablet (4 mg total) by mouth every 8 (eight) hours as needed for nausea or vomiting. 12/26/22  Yes Fredia Sorrow, MD  acetaminophen (TYLENOL) 650 MG CR tablet Take 650 mg by mouth every 8 (eight) hours as needed for pain.    [provider]  benazepril (LOTENSIN) 40 MG tablet Take 40 mg by mouth daily.    [provider]  hydrochlorothiazide (HYDRODIURIL) 25 MG tablet Take 25 mg by mouth daily.    [provider]  lisinopril (PRINIVIL,ZESTRIL) 10 MG tablet Take 10 mg by mouth daily.    Lucianne Lei, MD  methylPREDNISolone (MEDROL DOSEPAK) 4 MG TBPK tablet Take by mouth.    [provider]  naproxen (NAPROSYN) 500 MG tablet TAKE ONE TABLET TWICE DAILY WITH A MEAL Patient not taking: Reported on 11/23/2019 06/06/12   Charlett Blake, MD  tiZANidine (ZANAFLEX) 4 MG capsule  Take 4 mg by mouth 3 (three) times daily.    [provider]  traMADol (ULTRAM) 50 MG tablet Take by mouth every 6 (six) hours as needed.     [provider]  verapamil (CALAN-SR) 240 MG CR tablet Take 240 mg by mouth at bedtime.    Lucianne Lei, MD      Allergies    Patient has no known allergies.    Review of Systems   Review of Systems  Constitutional:  Negative for chills and fever.  HENT:  Negative for ear pain and sore throat.   Eyes:  Positive for visual disturbance. Negative for pain.  Respiratory:  Negative for cough and shortness of breath.   Cardiovascular:  Negative for chest pain and palpitations.  Gastrointestinal:  Positive for nausea and vomiting. Negative for abdominal pain.  Genitourinary:  Negative for dysuria and hematuria.  Musculoskeletal:  Negative for arthralgias and back pain.  Skin:  Negative for color change and rash.  Neurological:  Positive for weakness and headaches. Negative for seizures and syncope.  All other systems reviewed and are negative.   Physical Exam Updated Vital Signs BP (!) 153/100   Pulse 68   Temp 97.8 F (36.6 C) (Oral)   Resp 16   SpO2 99%  Physical Exam Vitals and nursing note reviewed.  Constitutional:      General: He is not in acute distress.    Appearance: Normal appearance. He is well-developed.  HENT:  Head: Normocephalic and atraumatic.     Mouth/Throat:     Mouth: Mucous membranes are moist.  Eyes:     Extraocular Movements: Extraocular movements intact.     Conjunctiva/sclera: Conjunctivae normal.     Pupils: Pupils are equal, round, and reactive to light.  Cardiovascular:     Rate and Rhythm: Normal rate and regular rhythm.     Heart sounds: No murmur heard. Pulmonary:     Effort: Pulmonary effort is normal. No respiratory distress.     Breath sounds: Normal breath sounds.  Abdominal:     Palpations: Abdomen is soft.     Tenderness: There is no abdominal tenderness.  Musculoskeletal:         General: No swelling.     Cervical back: Normal range of motion and neck supple. No rigidity.  Skin:    General: Skin is warm and dry.     Capillary Refill: Capillary refill takes less than 2 seconds.  Neurological:     Mental Status: He is alert. Mental status is at baseline.     Cranial Nerves: Cranial nerve deficit present.     Motor: Weakness present.     Comments: Patient without any worsening weakness on the right side.  Patient states he still has the visual deficit with the right eye.  Psychiatric:        Mood and Affect: Mood normal.     ED Results / Procedures / Treatments   Labs (all labs ordered are listed, but only abnormal results are displayed) Labs Reviewed  CBC WITH DIFFERENTIAL/PLATELET - Abnormal; Notable for the following components:      Result Value   WBC 13.4 (*)    RBC 6.44 (*)    HCT 52.7 (*)    Neutro Abs 11.3 (*)    All other components within normal limits  COMPREHENSIVE METABOLIC PANEL - Abnormal; Notable for the following components:   Glucose, Bld 129 (*)    ALT 116 (*)    All other components within normal limits    EKG EKG Interpretation  Date/Time:  Saturday December 26 2022 16:54:29 EDT Ventricular Rate:  66 PR Interval:  104 QRS Duration: 90 QT Interval:  420 QTC Calculation: 440 R Axis:   -11 Text Interpretation: Sinus rhythm with short PR Otherwise normal ECG No previous ECGs available Confirmed by Fredia Sorrow [id number] on 12/26/2022 4:55:46 PM  Radiology CT Head Wo Contrast  Result Date: 12/26/2022 CLINICAL DATA:  Sudden severe new onset headache, recent left PCA infarctions EXAM: CT HEAD WITHOUT CONTRAST TECHNIQUE: Contiguous axial images were obtained from the base of the skull through the vertex without intravenous contrast. RADIATION DOSE REDUCTION: This exam was performed according to the departmental dose-optimization program which includes automated exposure control, adjustment of the mA and/or kV according to patient  size and/or use of iterative reconstruction technique. COMPARISON:  12/23/2022 FINDINGS: Brain: No evidence of acute infarction, hemorrhage, hydrocephalus, extra-axial collection or mass lesion/mass effect. Left PCA territory infarctions previously identified by MRI are not apparent by CT. Vascular: No hyperdense vessel or unexpected calcification. Skull: Normal. Negative for fracture or focal lesion. Sinuses/Orbits: No acute finding. Other: None. IMPRESSION: No acute intracranial pathology. Left PCA territory infarctions previously identified by MRI are not apparent by CT. No evidence of hemorrhage or other complication. Electronically Signed   By: Delanna Ahmadi M.D.   On: 12/26/2022 14:57    Procedures Procedures    Medications Ordered in ED Medications  HYDROmorphone (DILAUDID) injection 1 mg (  1 mg Intravenous Given 12/26/22 1226)  ondansetron (ZOFRAN) injection 4 mg (4 mg Intravenous Given 12/26/22 1225)    ED Course/ Medical Decision Making/ A&P                             Medical Decision Making Amount and/or Complexity of Data Reviewed Labs: ordered. Radiology: ordered.  Risk Prescription drug management.   EKG without any arrhythmias.  Head CT here without evidence of any head bleed.  Patient improved with some pain medicine and antinausea medicine.  Symptoms could be a bit of a stomach bug or a viral illness.  CBC white count 13.4 hemoglobin [id number] complete metabolic panel blood sugar 129 renal function normal LFTs without significant abnormalities.  Head CT showed no intracranial pathology left PCA territorial infarcts previously identified by MRI or not apparent by CT.  No evidence of hemorrhage or other complication.  Had long discussion with neurohospitalist who reviewed all of the MRIs and CT from [facility] admission does not feel that we need to do additional MRIs today.  Patient's blood pressures in the range that they want [id number] to Q000111Q systolic.  Patient pretreated  with Zofran.  Close follow-up with Dr. Leonie Man from [facility] neurology.  And patient's mother is working on the physical therapy and Occupational Therapy that he needs following his discharge on March 15 from [facility].   Final Clinical Impression(s) / ED Diagnoses Final diagnoses:  Nonintractable headache, unspecified chronicity pattern, unspecified headache type  Nausea and vomiting, unspecified vomiting type    Rx / DC Orders ED Discharge Orders          Ordered    ondansetron (ZOFRAN-ODT) 4 MG disintegrating tablet  Every 8 hours PRN        12/26/22 1626              Fredia Sorrow, MD 12/26/22 1700    Fredia Sorrow, MD 12/26/22 1701

## 2022-12-30 ENCOUNTER — Ambulatory Visit: Payer: Medicaid Other | Admitting: Neurology

## 2022-12-30 ENCOUNTER — Encounter: Payer: Self-pay | Admitting: Neurology

## 2022-12-30 VITALS — BP 154/104 | HR 80 | Ht 72.0 in | Wt 220.0 lb

## 2022-12-30 DIAGNOSIS — H53461 Homonymous bilateral field defects, right side: Secondary | ICD-10-CM

## 2022-12-30 DIAGNOSIS — I639 Cerebral infarction, unspecified: Secondary | ICD-10-CM

## 2022-12-30 DIAGNOSIS — I63432 Cerebral infarction due to embolism of left posterior cerebral artery: Secondary | ICD-10-CM

## 2022-12-30 MED ORDER — CLOPIDOGREL BISULFATE 75 MG PO TABS: 75.0000 mg | ORAL_TABLET | Freq: Every day | ORAL | 5 refills | Status: AC

## 2022-12-30 NOTE — Progress Notes (Signed)
[facility] [address] [place]. [place] [address] [phone]       OFFICE CONSULT NOTE  Mr. Ross Wheeler Date of Birth:  09/27/1966 Medical Record Number:  [id number]   Referring MD:  Royann Shivers  Reason for Referral:  Stroke  HPI: Mr. [name] is a 57 year old pleasant African-American male seen today for initial office consultation visit for stroke.  He is accompanied by his mother.  History is obtained from them and review of electronic medical records in care everywhere.  Imaging results but actual films are not available for review.  He has past medical history of hypertension, hyperlipidemia, diabetes.  Clinical admitted to [facility] on 12/10/2022 with sudden onset of severe headache.  He thought the headache was migraine but did not go away for 2 days.  He also noticed decreased vision loss on the right side.  CT angiogram showed multifocal high-grade stenosis.  Occlusive embolus distal left P2 and proximal P3 segments of the left posterior cerebral artery.  There is moderate right P2 segment stenosis as well.  Patient was started on dual antiplatelet therapy aspirin and Plavix at discharge.  However he went back to the ER on 12/23/22 with altered mental status and worsening weakness on the right side with some generalized weakness.  He still has persistent right-sided hemianopsia.  MRI scan of the brain was repeated showed a few additional infarcts in the left medial temporal lobe, left occipital lobe and left thalamic in addition to expected evolutionary changes in the recent left PCA infarct.Carotid ultrasound Ross Wheeler previously in 12/10/2022 showed moderate right and mild left-sided plaque but less than 50% bilaterally 2D echo showed ejection fraction of 65 to 70%.  Left atrial size is upper limit of normal LDL cholesterol 116 mg percent.  A1c was 5.5 patient is presently on aspirin alone and is tolerating well without bruising or bleeding.  He states his blood  pressure is well-controlled at home though today it is 130/104.  He has quit smoking cigarettes and using marijuana.  He is currently wearing an external heart monitor for the last 1.  Has persistent right-sided sensory loss and peripheral vision loss.  He is a Production designer, theatre/television/film.  Lives will have trouble regarding his job.  He denies any prior history of strokes TIAs or significant neurological problems.  He does have a history of migraine headaches that  have not been very frequent.  Denies history of DVT, pulmonary embolism, family history of strokes or heart likely unrelated to ROS:   14 system review of systems is positive for vision loss, numbness, sensory loss, gait difficulty and all other systems negative  PMH:  Past Medical History:  Diagnosis Date   Hypertension     Social History:  Social History   Socioeconomic History   Marital status: Married    Spouse name: Not on file   Number of children: Not on file   Years of education: Not on file   Highest education level: Not on file  Occupational History   Not on file  Tobacco Use   Smoking status: Former    Types: Cigarettes   Smokeless tobacco: Never  Substance and Sexual Activity   Alcohol use: Not on file   Drug use: Not on file   Sexual activity: Not on file  Other Topics Concern   Not on file  Social History Narrative   Not on file   Social Determinants of Health   Financial Resource Strain: Not on  file  Food Insecurity: Not on file  Transportation Needs: Not on file  Physical Activity: Not on file  Stress: Not on file  Social Connections: Not on file  Intimate Partner Violence: Not on file    Medications:   Current Outpatient Medications on File Prior to Visit  Medication Sig Dispense Refill   acetaminophen (TYLENOL) 650 MG CR tablet Take 650 mg by mouth every 8 (eight) hours as needed for pain.     amLODipine (NORVASC) 10 MG tablet Take by mouth.     aspirin EC 81 MG tablet Take 81 mg by mouth  daily. Swallow whole.     atorvastatin (LIPITOR) 80 MG tablet Take 1 tablet by mouth daily.     DULoxetine (CYMBALTA) 20 MG capsule Take by mouth.     hydrochlorothiazide (HYDRODIURIL) 25 MG tablet Take 25 mg by mouth daily.     labetalol (NORMODYNE) 200 MG tablet Take by mouth.     levETIRAcetam (KEPPRA) 500 MG tablet Take by mouth.     losartan-hydrochlorothiazide (HYZAAR) 100-25 MG tablet Take by mouth.     ondansetron (ZOFRAN-ODT) 4 MG disintegrating tablet Take 1 tablet (4 mg total) by mouth every 8 (eight) hours as needed for nausea or vomiting. 12 tablet 1   tiZANidine (ZANAFLEX) 4 MG capsule Take 4 mg by mouth 3 (three) times daily.     traMADol (ULTRAM) 50 MG tablet Take by mouth every 6 (six) hours as needed.      No current facility-administered medications on file prior to visit.    Allergies:  No Known Allergies  Physical Exam General: well developed, well nourished pleasant middle-aged African-American male, seated, in no evident distress Head: head normocephalic and atraumatic.   Neck: supple with no carotid or supraclavicular bruits Cardiovascular: regular rate and rhythm, no murmurs Musculoskeletal: no deformity Skin:  no rash/petichiae Vascular:  Normal pulses all extremities  Neurologic Exam Mental Status: Awake and fully alert. Oriented to place and time. Recent and remote memory intact. Attention span, concentration and fund of knowledge appropriate. Mood and affect appropriate.  Cranial Nerves: Fundoscopic exam reveals sharp disc margins. Pupils equal, briskly reactive to light. Extraocular movements full without nystagmus. Visual fields full show dense right homonymous hemianopsia confrontation. Hearing intact. Facial sensation intact. Face, tongue, palate moves normally and symmetrically.  Motor: Normal bulk and tone. Normal strength in all tested extremity muscles.  Diminished fine finger movements on the right.  Orbits left or right upper extremity. Sensory.:   diminished touch , pinprick sensation right hemibody but preserved, position and vibratory sensation.  Coordination: Rapid alternating movements normal in all extremities. Finger-to-nose and heel-to-shin performed accurately bilaterally. Gait and Station: Arises from chair without difficulty. Stance is normal. Gait is cautious and slow but demonstrates normal stride length and balance . Able to heel, toe and tandem walk with moderate difficulty.  Reflexes: 1+ and symmetric. Toes downgoing.   NIHSS  3 Modified Rankin  2   ASSESSMENT: 57 year old African-American male with left posterior cerebral artery branch infarcts in February and March 2024 due to distal left posterior cerebral artery stenosis versus embolism from cryptogenic etiology.  Vascular risk factors of smoking cigarettes, marijuana use, hypertension and hyperlipidemia     PLAN:I had a long d/w patient about his recent cryptogenic stroke, right-sided peripheral vision loss, risk for recurrent stroke/TIAs, personally independently reviewed imaging studies and stroke evaluation results and answered questions.Continue aspirin 81 mg daily and clopidogrel 75 mg daily for 3 months and then Plavix alone for  secondary stroke prevention and maintain strict control of hypertension with blood pressure goal below 130/90, diabetes with hemoglobin A1c goal below 6.5% and lipids with LDL cholesterol goal below 70 mg/dL. I also advised the patient to eat a healthy diet with plenty of whole grains, cereals, fruits and vegetables, exercise regularly and maintain ideal body weight .encouraged him to start outpatient physical Occupational Therapy.  Continue ongoing external cardiac monitoring refer to cardiac electrophysiology for loop recorder insertion for paroxysmal A-fib.if ongoing cardiac monitoring is negative.  Patient was advised to quit smoking cigarettes and marijuana and not to drive until his peripheral vision loss improves.  Followup in the future  with nurse practitioner in 4 months or call earlier if necessary.  Greater than 50% time during this 45-minute consultation visit was spent on counseling and coordination of care about his PCA infarct and vision loss and discussion about evaluation and treatment and prevention and answering questions. Antony Contras, MD  Note: This document was prepared with digital dictation and possible smart phrase technology. Any transcriptional errors that result from this process are unintentional.

## 2022-12-30 NOTE — Patient Instructions (Signed)
I had a long d/w patient about his recent cryptogenic stroke, right-sided peripheral vision loss, risk for recurrent stroke/TIAs, personally independently reviewed imaging studies and stroke evaluation results and answered questions.Continue aspirin 81 mg daily and clopidogrel 75 mg daily for 3 months and then Plavix alone for secondary stroke prevention and maintain strict control of hypertension with blood pressure goal below 130/90, diabetes with hemoglobin A1c goal below 6.5% and lipids with LDL cholesterol goal below 70 mg/dL. I also advised the patient to eat a healthy diet with plenty of whole grains, cereals, fruits and vegetables, exercise regularly and maintain ideal body weight .encouraged him to start outpatient physical Occupational Therapy.  Continue ongoing external cardiac monitoring Urine refer to cardiac electrophysiology for loop recorder insertion for paroxysmal A-fib.  Patient was advised to quit smoking cigarettes and marijuana and not to drive.  Peripheral vision loss improves.  Followup in the future with nurse practitioner in 4 months or call earlier if necessary  Stroke Prevention Some medical conditions and behaviors can lead to a higher chance of having a stroke. You can help prevent a stroke by eating healthy, exercising, not smoking, and managing any medical conditions you have. Stroke is a leading cause of functional impairment. Primary prevention is particularly important because a majority of strokes are first-time events. Stroke changes the lives of not only those who experience a stroke but also their family and other caregivers. How can this condition affect me? A stroke is a medical emergency and should be treated right away. A stroke can lead to brain damage and can sometimes be life-threatening. If a person gets medical treatment right away, there is a better chance of surviving and recovering from a stroke. What can increase my risk? The following medical conditions  may increase your risk of a stroke: Cardiovascular disease. High blood pressure (hypertension). Diabetes. High cholesterol. Sickle cell disease. Blood clotting disorders (hypercoagulable state). Obesity. Sleep disorders (obstructive sleep apnea). Other risk factors include: Being older than age 3. Having a history of blood clots, stroke, or mini-stroke (transient ischemic attack, TIA). Genetic factors, such as race, ethnicity, or a family history of stroke. Smoking cigarettes or using other tobacco products. Taking birth control pills, especially if you also use tobacco. Heavy use of alcohol or drugs, especially cocaine and methamphetamine. Physical inactivity. What actions can I take to prevent this? Manage your health conditions High cholesterol levels. Eating a healthy diet is important for preventing high cholesterol. If cholesterol cannot be managed through diet alone, you may need to take medicines. Take any prescribed medicines to control your cholesterol as told by your health care provider. Hypertension. To reduce your risk of stroke, try to keep your blood pressure below 130/80. Eating a healthy diet and exercising regularly are important for controlling blood pressure. If these steps are not enough to manage your blood pressure, you may need to take medicines. Take any prescribed medicines to control hypertension as told by your health care provider. Ask your health care provider if you should monitor your blood pressure at home. Have your blood pressure checked every year, even if your blood pressure is normal. Blood pressure increases with age and some medical conditions. Diabetes. Eating a healthy diet and exercising regularly are important parts of managing your blood sugar (glucose). If your blood sugar cannot be managed through diet and exercise, you may need to take medicines. Take any prescribed medicines to control your diabetes as told by your health care  provider. Get evaluated for obstructive  sleep apnea. Talk to your health care provider about getting a sleep evaluation if you snore a lot or have excessive sleepiness. Make sure that any other medical conditions you have, such as atrial fibrillation or atherosclerosis, are managed. Nutrition Follow instructions from your health care provider about what to eat or drink to help manage your health condition. These instructions may include: Reducing your daily calorie intake. Limiting how much salt (sodium) you use to 1,500 milligrams (mg) each day. Using only healthy fats for cooking, such as olive oil, canola oil, or sunflower oil. Eating healthy foods. You can do this by: Choosing foods that are high in fiber, such as whole grains, and fresh fruits and vegetables. Eating at least 5 servings of fruits and vegetables a day. Try to fill one-half of your plate with fruits and vegetables at each meal. Choosing lean protein foods, such as lean cuts of meat, poultry without skin, fish, tofu, beans, and nuts. Eating low-fat dairy products. Avoiding foods that are high in sodium. This can help lower blood pressure. Avoiding foods that have saturated fat, trans fat, and cholesterol. This can help prevent high cholesterol. Avoiding processed and prepared foods. Counting your daily carbohydrate intake.  Lifestyle If you drink alcohol: Limit how much you have to: 0-1 drink a day for women who are not pregnant. 0-2 drinks a day for men. Know how much alcohol is in your drink. In the [place] one drink equals one 12 oz bottle of beer (321mL), one 5 oz glass of wine (151mL), or one 1 oz glass of hard liquor (57mL). Do not use any products that contain nicotine or tobacco. These products include cigarettes, chewing tobacco, and vaping devices, such as e-cigarettes. If you need help quitting, ask your health care provider. Avoid secondhand smoke. Do not use drugs. Activity  Try to stay at a healthy  weight. Get at least 30 minutes of exercise on most days, such as: Fast walking. Biking. Swimming. Medicines Take over-the-counter and prescription medicines only as told by your health care provider. Aspirin or blood thinners (antiplatelets or anticoagulants) may be recommended to reduce your risk of forming blood clots that can lead to stroke. Avoid taking birth control pills. Talk to your health care provider about the risks of taking birth control pills if: You are over 92 years old. You smoke. You get very bad headaches. You have had a blood clot. Where to find more information American Stroke Association: www.strokeassociation.org Get help right away if: You or a loved one has any symptoms of a stroke. "BE FAST" is an easy way to remember the main warning signs of a stroke: B - Balance. Signs are dizziness, sudden trouble walking, or loss of balance. E - Eyes. Signs are trouble seeing or a sudden change in vision. F - Face. Signs are sudden weakness or numbness of the face, or the face or eyelid drooping on one side. A - Arms. Signs are weakness or numbness in an arm. This happens suddenly and usually on one side of the body. S - Speech. Signs are sudden trouble speaking, slurred speech, or trouble understanding what people say. T - Time. Time to call emergency services. Write down what time symptoms started. You or a loved one has other signs of a stroke, such as: A sudden, severe headache with no known cause. Nausea or vomiting. Seizure. These symptoms may represent a serious problem that is an emergency. Do not wait to see if the symptoms will go away. Get medical  help right away. Call your local emergency services (911 in the [place] Do not drive yourself to the hospital. Summary You can help to prevent a stroke by eating healthy, exercising, not smoking, limiting alcohol intake, and managing any medical conditions you may have. Do not use any products that contain nicotine or  tobacco. These include cigarettes, chewing tobacco, and vaping devices, such as e-cigarettes. If you need help quitting, ask your health care provider. Remember "BE FAST" for warning signs of a stroke. Get help right away if you or a loved one has any of these signs. This information is not intended to replace advice given to you by your health care provider. Make sure you discuss any questions you have with your health care provider. Document Revised: 04/11/2020 Document Reviewed: 04/29/2020 Elsevier Patient Education  [place].

## 2023-02-23 ENCOUNTER — Ambulatory Visit: Payer: Medicaid Other | Admitting: Internal Medicine

## 2023-03-17 ENCOUNTER — Emergency Department (HOSPITAL_COMMUNITY): Payer: Medicaid Other

## 2023-03-17 ENCOUNTER — Other Ambulatory Visit: Payer: Self-pay

## 2023-03-17 ENCOUNTER — Emergency Department (HOSPITAL_COMMUNITY)
Admission: EM | Admit: 2023-03-17 | Discharge: 2023-03-17 | Disposition: A | Discharge status: Home / Self Care | Payer: Medicaid Other | Attending: Emergency Medicine | Admitting: Emergency Medicine

## 2023-03-17 ENCOUNTER — Encounter (HOSPITAL_COMMUNITY): Payer: Self-pay

## 2023-03-17 DIAGNOSIS — H5789 Other specified disorders of eye and adnexa: Secondary | ICD-10-CM | POA: Diagnosis not present

## 2023-03-17 DIAGNOSIS — R7309 Other abnormal glucose: Secondary | ICD-10-CM | POA: Insufficient documentation

## 2023-03-17 DIAGNOSIS — R079 Chest pain, unspecified: Secondary | ICD-10-CM | POA: Insufficient documentation

## 2023-03-17 DIAGNOSIS — R209 Unspecified disturbances of skin sensation: Secondary | ICD-10-CM | POA: Diagnosis not present

## 2023-03-17 DIAGNOSIS — I10 Essential (primary) hypertension: Secondary | ICD-10-CM | POA: Insufficient documentation

## 2023-03-17 DIAGNOSIS — Z7982 Long term (current) use of aspirin: Secondary | ICD-10-CM | POA: Insufficient documentation

## 2023-03-17 DIAGNOSIS — Z79899 Other long term (current) drug therapy: Secondary | ICD-10-CM | POA: Diagnosis not present

## 2023-03-17 DIAGNOSIS — H539 Unspecified visual disturbance: Secondary | ICD-10-CM

## 2023-03-17 LAB — CBC
HCT: 46.2 % (ref 39.0–52.0)
Hemoglobin: 15 g/dL (ref 13.0–17.0)
MCH: 26.7 pg (ref 26.0–34.0)
MCHC: 32.5 g/dL (ref 30.0–36.0)
MCV: 82.4 fL (ref 80.0–100.0)
Platelets: 272 10*3/uL (ref 150–400)
RBC: 5.61 MIL/uL (ref 4.22–5.81)
RDW: 15.9 % — ABNORMAL HIGH (ref 11.5–15.5)
WBC: 8.2 10*3/uL (ref 4.0–10.5)
nRBC: 0 % (ref 0.0–0.2)

## 2023-03-17 LAB — HEPATIC FUNCTION PANEL
ALT: 32 U/L (ref 0–44)
AST: 23 U/L (ref 15–41)
Albumin: 3.6 g/dL (ref 3.5–5.0)
Alkaline Phosphatase: 64 U/L (ref 38–126)
Bilirubin, Direct: <0.1 mg/dL (ref 0.0–0.2)
Total Bilirubin: 0.6 mg/dL (ref 0.3–1.2)
Total Protein: 6.7 g/dL (ref 6.5–8.1)

## 2023-03-17 LAB — URINALYSIS, ROUTINE W REFLEX MICROSCOPIC
Bilirubin Urine: NEGATIVE
Glucose, UA: NEGATIVE mg/dL
Hgb urine dipstick: NEGATIVE
Ketones, ur: NEGATIVE mg/dL
Leukocytes,Ua: NEGATIVE
Nitrite: NEGATIVE
Protein, ur: NEGATIVE mg/dL
Specific Gravity, Urine: 1.013 (ref 1.005–1.030)
pH: 7 (ref 5.0–8.0)

## 2023-03-17 LAB — BASIC METABOLIC PANEL
Anion gap: 10 (ref 5–15)
BUN: 11 mg/dL (ref 6–20)
CO2: 18 mmol/L — ABNORMAL LOW (ref 22–32)
Calcium: 8.9 mg/dL (ref 8.9–10.3)
Chloride: 109 mmol/L (ref 98–111)
Creatinine, Ser: 1.01 mg/dL (ref 0.61–1.24)
GFR, Estimated: >60 mL/min (ref >60)
Glucose, Bld: 107 mg/dL — ABNORMAL HIGH (ref 70–99)
Potassium: 3.5 mmol/L (ref 3.5–5.1)
Sodium: 137 mmol/L (ref 135–145)

## 2023-03-17 LAB — I-STAT CHEM 8, ED
BUN: 12 mg/dL (ref 6–20)
Calcium, Ion: 1.05 mmol/L — ABNORMAL LOW (ref 1.15–1.40)
Chloride: 106 mmol/L (ref 98–111)
Creatinine, Ser: 1 mg/dL (ref 0.61–1.24)
Glucose, Bld: 96 mg/dL (ref 70–99)
HCT: 47 % (ref 39.0–52.0)
Hemoglobin: 16 g/dL (ref 13.0–17.0)
Potassium: 3.6 mmol/L (ref 3.5–5.1)
Sodium: 140 mmol/L (ref 135–145)
TCO2: 25 mmol/L (ref 22–32)

## 2023-03-17 LAB — RAPID URINE DRUG SCREEN, HOSP PERFORMED
Amphetamines: NOT DETECTED
Barbiturates: NOT DETECTED
Benzodiazepines: NOT DETECTED
Cocaine: POSITIVE — AB
Opiates: NOT DETECTED
Tetrahydrocannabinol: POSITIVE — AB

## 2023-03-17 LAB — ETHANOL: Alcohol, Ethyl (B): <10 mg/dL (ref <10)

## 2023-03-17 LAB — CBG MONITORING, ED: Glucose-Capillary: 100 mg/dL — ABNORMAL HIGH (ref 70–99)

## 2023-03-17 LAB — MAGNESIUM: Magnesium: 1.8 mg/dL (ref 1.7–2.4)

## 2023-03-17 LAB — TROPONIN I (HIGH SENSITIVITY)
Troponin I (High Sensitivity): 5 ng/L (ref <18)
Troponin I (High Sensitivity): 5 ng/L (ref <18)

## 2023-03-17 LAB — PROTIME-INR
INR: 1 (ref 0.8–1.2)
Prothrombin Time: 13.9 seconds (ref 11.4–15.2)

## 2023-03-17 MED ORDER — DIPHENHYDRAMINE HCL 50 MG/ML IJ SOLN
12.5000 mg | Freq: Once | INTRAMUSCULAR | Status: AC
  Administered 2023-03-17: 12.5 mg via INTRAVENOUS
  Filled 2023-03-17: qty 1

## 2023-03-17 MED ORDER — FENTANYL CITRATE PF 50 MCG/ML IJ SOSY: 50.0000 ug | PREFILLED_SYRINGE | Freq: Once | INTRAMUSCULAR | Status: DC

## 2023-03-17 MED ORDER — AMLODIPINE BESYLATE 5 MG PO TABS
10.0000 mg | ORAL_TABLET | Freq: Every day | ORAL | Status: DC
  Administered 2023-03-17: 10 mg via ORAL
  Filled 2023-03-17: qty 2

## 2023-03-17 MED ORDER — METOCLOPRAMIDE HCL 5 MG/ML IJ SOLN
10.0000 mg | Freq: Once | INTRAMUSCULAR | Status: AC
  Administered 2023-03-17: 10 mg via INTRAVENOUS
  Filled 2023-03-17: qty 2

## 2023-03-17 MED ORDER — LACTATED RINGERS IV BOLUS
500.0000 mL | Freq: Once | INTRAVENOUS | Status: AC
  Administered 2023-03-17: 500 mL via INTRAVENOUS

## 2023-03-17 MED ORDER — LOSARTAN POTASSIUM 50 MG PO TABS
100.0000 mg | ORAL_TABLET | Freq: Every day | ORAL | Status: DC
  Administered 2023-03-17: 100 mg via ORAL
  Filled 2023-03-17: qty 2

## 2023-03-17 NOTE — ED Provider Notes (Signed)
[place] EMERGENCY DEPARTMENT AT [facility] Provider Note   CSN: [id number] Arrival date & time: 03/17/23  1555     History  Chief Complaint  Patient presents with   Chest Pain    Ross Wheeler is a 57 y.o. male.   Chest Pain Associated symptoms: numbness   Patient presents for chest pain.  Medical history includes cocaine use, HTN, HLD, CVA.  Onset of chest pain was 1 hour prior to arrival.  It does not radiate.  He was given 324 of ASA and 1 SL NTG prior to arrival without relief.  Pain is located on upper left aspect of anterior chest.  He does not have any other areas of discomfort.  He does endorse blurry vision and numbness in bilateral feet.  He states that he has had multiple strokes in the past.  At baseline, he has loss of lateral vision from his right eye.  Currently, he is experiencing blurriness in both eyes.  He denies any current illicit drug use other than occasional marijuana.  He states that he takes 7 daily medications but is not sure which ones.  His mother helps administer these.  He is not sure if he is on a blood thinner.  Per chart review, he was hospitalized in March for CVA.  Medications at time of discharge were DAPT with aspirin and Plavix.     Home Medications Prior to Admission medications   Medication Sig Start Date End Date Taking? Authorizing Provider  acetaminophen (TYLENOL) 650 MG CR tablet Take 650 mg by mouth every 8 (eight) hours as needed for pain.    [provider]  amLODipine (NORVASC) 10 MG tablet Take by mouth. 12/25/22 01/24/23  [provider]  aspirin EC 81 MG tablet Take 81 mg by mouth daily. Swallow whole.    [provider]  clopidogrel (PLAVIX) 75 MG tablet Take 1 tablet (75 mg total) by mouth daily. 12/30/22   Micki Riley, MD  hydrochlorothiazide (HYDRODIURIL) 25 MG tablet Take 25 mg by mouth daily.    [provider]  losartan-hydrochlorothiazide (HYZAAR) 100-25 MG tablet Take by  mouth. 12/25/22 01/24/23  [provider]  ondansetron (ZOFRAN-ODT) 4 MG disintegrating tablet Take 1 tablet (4 mg total) by mouth every 8 (eight) hours as needed for nausea or vomiting. 12/26/22   Vanetta Mulders, MD  tiZANidine (ZANAFLEX) 4 MG capsule Take 4 mg by mouth 3 (three) times daily.    [provider]  traMADol (ULTRAM) 50 MG tablet Take by mouth every 6 (six) hours as needed.     [provider]      Allergies    Patient has no known allergies.    Review of Systems   Review of Systems  Eyes:  Positive for visual disturbance.  Cardiovascular:  Positive for chest pain.  Neurological:  Positive for numbness.  All other systems reviewed and are negative.   Physical Exam Updated Vital Signs BP (!) 144/104   Pulse 72   Temp 97.6 F (36.4 C) (Oral)   Resp 15   Ht 6' (1.829 m)   Wt 95.3 kg   SpO2 100%   BMI 28.48 kg/m  Physical Exam Vitals and nursing note reviewed.  Constitutional:      General: He is not in acute distress.    Appearance: He is well-developed. He is not toxic-appearing or diaphoretic.  HENT:     Head: Normocephalic and atraumatic.  Eyes:     General:  Visual field deficit present.     Conjunctiva/sclera: Conjunctivae normal.     Comments: Severely diminished visual acuity.Can see movement only bilaterally.  Cardiovascular:     Rate and Rhythm: Normal rate and regular rhythm.     Heart sounds: No murmur heard. Pulmonary:     Effort: Pulmonary effort is normal. No tachypnea or respiratory distress.     Breath sounds: Normal breath sounds. No decreased breath sounds, wheezing, rhonchi or rales.  Chest:     Chest wall: No tenderness.  Abdominal:     Palpations: Abdomen is soft.     Tenderness: There is no abdominal tenderness.  Musculoskeletal:        General: No swelling.     Cervical back: Normal range of motion and neck supple.     Right lower leg: No edema.     Left lower leg: No edema.  Skin:    General: Skin is  warm and dry.     Coloration: Skin is not cyanotic or pale.  Neurological:     Mental Status: He is alert and oriented to person, place, and time.     Cranial Nerves: No cranial nerve deficit, dysarthria or facial asymmetry.     Sensory: Sensory deficit (Diminished sensation to right upper extremity.) present.     Motor: No weakness, abnormal muscle tone or pronator drift.  Psychiatric:        Mood and Affect: Mood normal.     ED Results / Procedures / Treatments   Labs (all labs ordered are listed, but only abnormal results are displayed) Labs Reviewed  BASIC METABOLIC PANEL - Abnormal; Notable for the following components:      Result Value   CO2 18 (*)    Glucose, Bld 107 (*)    All other components within normal limits  CBC - Abnormal; Notable for the following components:   RDW 15.9 (*)    All other components within normal limits  RAPID URINE DRUG SCREEN, HOSP PERFORMED - Abnormal; Notable for the following components:   Cocaine POSITIVE (*)    Tetrahydrocannabinol POSITIVE (*)    All other components within normal limits  I-STAT CHEM 8, ED - Abnormal; Notable for the following components:   Calcium, Ion 1.05 (*)    All other components within normal limits  CBG MONITORING, ED - Abnormal; Notable for the following components:   Glucose-Capillary 100 (*)    All other components within normal limits  HEPATIC FUNCTION PANEL  MAGNESIUM  PROTIME-INR  ETHANOL  URINALYSIS, ROUTINE W REFLEX MICROSCOPIC  TROPONIN I (HIGH SENSITIVITY)  TROPONIN I (HIGH SENSITIVITY)    EKG EKG Interpretation  Date/Time:  Wednesday March 17 2023 15:55:46 EDT Ventricular Rate:  81 PR Interval:    QRS Duration: 82 QT Interval:  368 QTC Calculation: 427 R Axis:   -3 Text Interpretation: Sinus rhythm Confirmed by Gloris Manchester [phone]) on 03/17/2023 4:44:40 PM  Radiology DG Chest 2 View  Result Date: 03/17/2023 CLINICAL DATA:  Chest pain EXAM: CHEST - 2 VIEW COMPARISON:  Chest radiograph dated  12/23/2022 FINDINGS: Normal lung volumes. No focal consolidations. No pleural effusion or pneumothorax. The heart size and mediastinal contours are within normal limits. No acute osseous abnormality. IMPRESSION: No active cardiopulmonary disease. Electronically Signed   By: Agustin Cree M.D.   On: 03/17/2023 18:29   MR BRAIN WO CONTRAST  Result Date: 03/17/2023 CLINICAL DATA:  Or neuro deficit, acute, stroke suspected. Acute visual loss. EXAM: MRI HEAD WITHOUT CONTRAST TECHNIQUE:  Multiplanar, multiecho pulse sequences of the brain and surrounding structures were obtained without intravenous contrast. COMPARISON:  Head CT earlier same day.  MRI 12/24/2022. FINDINGS: Brain: Diffusion imaging is negative for acute or subacute infarction. No focal abnormality affects the brainstem or cerebellum. There is old infarction in the left occipital lobe corresponding to the PCA insults shown on the prior brain MRI. Small infarction in the lateral thalamus also seen related to that insult. There are only a few punctate foci of T2 and FLAIR signal within the cerebral hemispheric white matter. Otherwise, the brain has a normal appearance. No mass, hemorrhage, hydrocephalus or extra-axial collection. Vascular: Major vessels at the base of the brain show flow. Skull and upper cervical spine: Negative Sinuses/Orbits: Clear/normal Other: None IMPRESSION: No acute finding. Old left PCA territory infarction affecting the left occipital lobe and the left lateral thalamus. Few punctate foci of T2 and FLAIR signal within the cerebral hemispheric white matter. Electronically Signed   By: Paulina Fusi M.D.   On: 03/17/2023 17:51   CT HEAD CODE STROKE WO CONTRAST  Result Date: 03/17/2023 CLINICAL DATA:  Code stroke. Neuro deficit, acute, stroke suspected. Acute visual change. EXAM: CT HEAD WITHOUT CONTRAST TECHNIQUE: Contiguous axial images were obtained from the base of the skull through the vertex without intravenous contrast. RADIATION  DOSE REDUCTION: This exam was performed according to the departmental dose-optimization program which includes automated exposure control, adjustment of the mA and/or kV according to patient size and/or use of iterative reconstruction technique. COMPARISON:  12/26/2022.  12/24/2022. FINDINGS: Brain: No abnormality seen affecting the brainstem or cerebellum. There is now low-density in the left occipital lobe at the site of the acute infarction shown in March of this year. Tiny left thalamic stroke previously seen is not visible by CT. The brain otherwise appears normal. No other vascular territory insult. No mass, hemorrhage, hydrocephalus or extra-axial collection. Vascular: No abnormal vascular finding. Skull: Negative Sinuses/Orbits: Clear/normal Other: None ASPECTS ([place]ta Stroke Program Early CT Score) - Ganglionic level infarction (caudate, lentiform nuclei, internal capsule, insula, M1-M3 cortex): 7 - Supraganglionic infarction (M4-M6 cortex): 3 Total score (0-10 with 10 being normal): 10 IMPRESSION: 1. Low-density in the left occipital lobe at the site of the acute infarction shown in March of this year. Tiny left thalamic stroke previously seen is not visible by CT. No new or acute finding. 2. Aspects is 10. These results were communicated to Dr. Derry Lory at 5:14 pm on 03/17/2023 by text page via the [facility] messaging system. Electronically Signed   By: Paulina Fusi M.D.   On: 03/17/2023 17:16    Procedures Procedures    Medications Ordered in ED Medications  fentaNYL (SUBLIMAZE) injection 50 mcg (has no administration in time range)  amLODipine (NORVASC) tablet 10 mg (10 mg Oral Given 03/17/23 1955)  losartan (COZAAR) tablet 100 mg (100 mg Oral Given 03/17/23 1955)  metoCLOPramide (REGLAN) injection 10 mg (10 mg Intravenous Given 03/17/23 1932)  diphenhydrAMINE (BENADRYL) injection 12.5 mg (12.5 mg Intravenous Given 03/17/23 1929)  lactated ringers bolus 500 mL (500 mLs Intravenous New Bag/Given 03/17/23  1936)    ED Course/ Medical Decision Making/ A&P                             Medical Decision Making Amount and/or Complexity of Data Reviewed Labs: ordered. Radiology: ordered.  Risk Prescription drug management.   This patient presents to the ED for concern of chest pain and  and visual disturbance, this involves an extensive number of treatment options, and is a complaint that carries with it a high risk of complications and morbidity.  The differential diagnosis includes ACS, CVA, TIA, seizure, complex migraine, illicit drug use   Co morbidities that complicate the patient evaluation  cocaine use, HTN, HLD, CVA   Additional history obtained:  Additional history obtained from patient's mother External records from outside source obtained and reviewed including EMR   Lab Tests:  I Ordered, and personally interpreted labs.  The pertinent results include: Normal hemoglobin, no leukocytosis, normal kidney function, normal electrolytes, normal troponins x 2.  UDS was positive for cocaine and THC.   Imaging Studies ordered:  I ordered imaging studies including CT head, MRI brain, chest x-ray I independently visualized and interpreted imaging which showed no acute findings I agree with the radiologist interpretation   Cardiac Monitoring: / EKG:  The patient was maintained on a cardiac monitor.  I personally viewed and interpreted the cardiac monitored which showed an underlying rhythm of: Sinus rhythm   Problem List / ED Course / Critical interventions / Medication management  Patient presents for initial complaint of chest pain.  On his arrival in the ED, he also stated that he now has severely blurry vision from both eyes.  He does have a history of multiple CVAs.  At baseline, he has loss of the lateral vision in his right eye.  Currently, he has severely diminished visual acuity in both eyes.  He is able to see lights and movement.  Blood pressure is only modestly  elevated.  EKG does not show evidence of STEMI.  Patient states that he normally has normal vision out of his left eye and did have normal vision 1.5 hours ago.  Because of this acute onset of visual loss, code stroke was initiated.  Patient underwent noncontrasted CT scan of head which did not show acute findings.  He underwent stat MRI which was also negative for any new findings.  Initial lab work is also reassuring.  Patient was given Reglan and Benadryl for treatment of his mild headache.  He had continued improvement in his blood pressure and had resolution of symptoms.  Second troponin was normal.  Patient has sustained resolution of symptoms.  His UDS was positive for cocaine.  This may very well have contributed to his symptoms today.  Patient denies any known use of cocaine.  He does state that he is living with his significant other and is worried that she may have been giving him illicit drugs without his knowledge.  Currently, he is joined by his mother at bedside.  Mother states that she will take him home tonight and he will likely return home to [facility] tomorrow.  He does see a cardiologist in [place] and will be able to follow-up with them.  He was discharged in stable condition. I ordered medication including Norvasc, Cozaar for hypertension; IV fluids for hydration; Reglan and Benadryl for headache Reevaluation of the patient after these medicines showed that the patient resolved I have reviewed the patients home medicines and have made adjustments as needed   Social Determinants of Health:  History of polysubstance abuse        Final Clinical Impression(s) / ED Diagnoses Final diagnoses:  Chest pain, unspecified type    Rx / DC Orders ED Discharge Orders     None         Gloris Manchester, MD 03/17/23 2229

## 2023-03-17 NOTE — ED Notes (Signed)
Pt discharged to home, NAD noted 

## 2023-03-17 NOTE — Consult Note (Addendum)
NEUROLOGY CONSULTATION NOTE   Date of service: March 17, 2023 Patient Name: Ross Wheeler MRN:  [id number] DOB:  30-May-1966 Reason for consult: "left sided chest pain, BL hand numbness/tingling, vision loss" Requesting Provider: Gloris Manchester, MD _ _ _   _ __   _ __ _ _  __ __   _ __   __ _  History of Present Illness  Ross Wheeler is a 57 y.o. 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 Reported initially that he was unable to see anything except a bright light, however, was able to count fingers held up in front of him and pupils equal round and reactive BL.  LKW was an hour prior to arrival to the ED around 3pm. Reports everything is very bright in his vision. No headache or eye pain.  LKW: 1500 on 03/17/23. mRS: 0 tNKASE: not given, stroke within the last 3 months, some concern for potential functional overlay this time. MRI Brain negative for acute stroke. Thrombectomy: not offered, no LVO NIHSS components Score: Comment  1a Level of Conscious 0[x]  1[]  2[]  3[]      1b LOC Questions 0[x]  1[]  2[]       1c LOC Commands 0[x]  1[]  2[]       2 Best Gaze 0[x]  1[]  2[]       3 Visual 0[]  1[]  2[]  3[x]      4 Facial Palsy 0[x]  1[]  2[]  3[]      5a Motor Arm - left 0[x]  1[]  2[]  3[]  4[]  UN[]    5b Motor Arm - Right 0[x]  1[]  2[]  3[]  4[]  UN[]    6a Motor Leg - Left 0[x]  1[]  2[]  3[]  4[]  UN[]    6b Motor Leg - Right 0[x]  1[]  2[]  3[]  4[]  UN[]    7 Limb Ataxia 0[x]  1[]  2[]  3[]  UN[]     8 Sensory 0[x]  1[]  2[]  UN[]      9 Best Language 0[x]  1[]  2[]  3[]      10 Dysarthria 0[x]  1[]  2[]  UN[]      11 Extinct. and Inattention 0[x]  1[]  2[]       TOTAL: 3     ROS   Constitutional Denies weight loss, fever and chills.   HEENT + changes in vision with intact hearing.  Respiratory Denies SOB and  cough.  CV Denies palpitations and CP  GI Denies abdominal pain, nausea, vomiting and diarrhea.  GU Denies dysuria and urinary frequency.   MSK Denies myalgia and joint pain.   Skin Denies rash and pruritus.   Neurological Denies headache and syncope.   Psychiatric Denies recent changes in mood. Denies anxiety and depression.    Past History   Past Medical History:  Diagnosis Date   Cocaine use    CVA (cerebral vascular accident) (HCC)    Elevated serum creatinine    Hypertension    Syncope    Synovitis of right knee    Past Surgical History:  Procedure Laterality Date   APPENDECTOMY     Family History  Problem Relation Age of Onset   Hypertension Mother    Diabetes Father    Heart disease Father    Hypertension Father    Social History   Socioeconomic History   Marital status: Married    Spouse name: Not on file   Number of children: Not  on file   Years of education: Not on file   Highest education level: Not on file  Occupational History   Not on file  Tobacco Use   Smoking status: Former    Types: Cigarettes   Smokeless tobacco: Never  Substance and Sexual Activity   Alcohol use: Not on file   Drug use: Yes    Types: "Crack" cocaine   Sexual activity: Not on file  Other Topics Concern   Not on file  Social History Narrative   Not on file   Social Determinants of Health   Financial Resource Strain: Not on file  Food Insecurity: Not on file  Transportation Needs: Not on file  Physical Activity: Not on file  Stress: Not on file  Social Connections: Not on file   No Known Allergies  Medications  (Not in a hospital admission)    Vitals   Vitals:   03/17/23 1630 03/17/23 1631 03/17/23 1645 03/17/23 1700  BP: (!) 151/102  (!) 149/94   Pulse: 78  71 68  Resp: (!) 9  11 11   Temp:      TempSrc:      SpO2: 100%  100% 98%  Weight:  95.3 kg    Height:  6' (1.829 m)       Body mass index is 28.48 kg/m.  Physical Exam   General: Laying  comfortably in bed; in no acute distress.  HENT: Normal oropharynx and mucosa. Normal external appearance of ears and nose.  Neck: Supple, no pain or tenderness  CV: No JVD. No peripheral edema.  Pulmonary: Symmetric Chest rise. Normal respiratory effort.  Abdomen: Soft to touch, non-tender.  Ext: No cyanosis, edema, or deformity  Skin: No rash. Normal palpation of skin.   Musculoskeletal: Normal digits and nails by inspection. No clubbing.   Neurologic Examination  Mental status/Cognition: Alert, oriented to self, place, month and year, good attention.  Speech/language: Fluent, comprehension intact, object naming intact, repetition intact.  Cranial nerves:   CN II Pupils equal and reactive to light, R hemianopsia, blinks to threat on the left, was able to count fingers held up in front of him.   CN III,IV,VI EOM intact, no gaze preference or deviation, no nystagmus    CN V normal sensation in V1, V2, and V3 segments bilaterally    CN VII no asymmetry, no nasolabial fold flattening    CN VIII normal hearing to speech    CN IX & X normal palatal elevation, no uvular deviation    CN XI 5/5 head turn and 5/5 shoulder shrug bilaterally    CN XII midline tongue protrusion    Motor:  Muscle bulk: normal, tone normal, pronator drift none tremor none Mvmt Root Nerve  Muscle Right Left Comments  SA C5/6 Ax Deltoid 5 5   EF C5/6 Mc Biceps 5 5   EE C6/7/8 Rad Triceps 5 5   WF C6/7 Med FCR     WE C7/8 PIN ECU     F Ab C8/T1 U ADM/FDI 5 5   HF L1/2/3 Fem Illopsoas 5 5   KE L2/3/4 Fem Quad 5 5   DF L4/5 D Peron Tib Ant 5 5   PF S1/2 Tibial Grc/Sol 5 5    Sensation:  Light touch Intact throughout   Pin prick    Temperature    Vibration   Proprioception    Coordination/Complex Motor:  - Finger to Nose intact BL - Heel to shin intact BL - Rapid  alternating movement are normal - Gait: deferred.  Labs   CBC:  Recent Labs  Lab 03/17/23 1630 03/17/23 1716  WBC 8.2  --   HGB 15.0  16.0  HCT 46.2 47.0  MCV 82.4  --   PLT 272  --     Basic Metabolic Panel:  Lab Results  Component Value Date   NA 140 03/17/2023   K 3.6 03/17/2023   CO2 18 (L) 03/17/2023   GLUCOSE 96 03/17/2023   BUN 12 03/17/2023   CREATININE 1.00 03/17/2023   CALCIUM 8.9 03/17/2023   GFRNONAA >60 03/17/2023   Lipid Panel: No results found for: "LDLCALC" HgbA1c: No results found for: "HGBA1C" Urine Drug Screen: No results found for: "LABOPIA", "COCAINSCRNUR", "LABBENZ", "AMPHETMU", "THCU", "LABBARB"  Alcohol Level No results found for: "ETH"  CT Head without contrast(Personally reviewed): CTH was negative for a large hypodensity concerning for a large territory infarct or hyperdensity concerning for an ICH  MRI Brain(Personally reviewed): No acute stroke  Impression   Ross Wheeler is a 57 y.o. 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 Reported initially that he was unable to see anything except a bright light, however, was able to count fingers held up in front of him and pupils equal round and reactive BL. His neurologic examination is notable for R hemianopsia, decreased vision.  Not a candidate for tnkase 2/2 stroke within the last 3 months. No stroke on MRI Brain and thus not a candidate for thrombectomy. Exam concerning for functional overlay. Unlikely to be CRAO given no APD, blinks to threat on the left.  Recommendations  - no further inpatient neurological workup. - discuss vision loss with optho for additional recommendations. ______________________________________________________________________   Thank you for the opportunity to take part in the care of this patient. If you have any further questions, please contact the neurology consultation  attending.  Signed,  Erick Blinks [facility] _ _ _   _ __   _ __ _ _  __ __   _ __   __ _

## 2023-03-17 NOTE — Code Documentation (Addendum)
[name] is a 57 yr old male with a PMH of prior stroke. He is presenting to [facility] via EMS on 03/17/2023. Pt was LKW "about an hour ago" then reports CP and bilateral vision loss. Code stroke activated in triage. He has ASA and Plavix on his med list.     Pt labs/CBG VS obtained. Airway cleared. Pt met by stroke team in CT. Pt with no focal deficits, only bilateral vision loss which he describes as brightness. NIHSS 4. The following imaging was completed: CT, MRI. CT scan negative for hemorrhage per Dr. Derry Lory. MRI negative for ischemia per Dr. Derry Lory. Code stroke cancelled. Handoff with ED RN complete.  Pt ineligible for TNK and thrombectomy as MRI negative for stroke.

## 2023-03-17 NOTE — Discharge Instructions (Signed)
Take home medications as prescribed.  Avoid illicit drug use.  Follow-up with your cardiologist.  Return emergency department for any new or worsening symptoms of concern.

## 2023-03-17 NOTE — ED Triage Notes (Signed)
Pt BIB [facility] from home d/t CP that started 45 min prior to calling 911. Describes his central CP as dull & denies radiation, nothing has gave him any pain relief. EMS reports 12L was good, he denies SOB, no cardiac Hx, was given 324mg  ASA & 1 Nitro. 125/86, RR 24, 99% on RA, 88 bpm, CBG 148. 20g Lt FA PIV.

## 2023-05-03 DIAGNOSIS — Z0271 Encounter for disability determination: Secondary | ICD-10-CM

## 2023-05-04 DIAGNOSIS — Z0271 Encounter for disability determination: Secondary | ICD-10-CM

## 2023-05-05 ENCOUNTER — Ambulatory Visit: Payer: Medicaid Other | Admitting: Adult Health
